# Patient Record
Sex: Female | Born: 1980 | Race: Black or African American | Hispanic: No | Marital: Married | Smoking: Former smoker
Health system: Southern US, Community
[De-identification: ages and names within clinical notes are randomized; demographics above are authoritative.]

## PROBLEM LIST (undated history)

## (undated) DIAGNOSIS — Z8371 Family history of colonic polyps: Secondary | ICD-10-CM

## (undated) DIAGNOSIS — G932 Benign intracranial hypertension: Secondary | ICD-10-CM

## (undated) DIAGNOSIS — Z800 Family history of malignant neoplasm of digestive organs: Secondary | ICD-10-CM

## (undated) DIAGNOSIS — Z803 Family history of malignant neoplasm of breast: Secondary | ICD-10-CM

## (undated) DIAGNOSIS — Z8041 Family history of malignant neoplasm of ovary: Secondary | ICD-10-CM

## (undated) DIAGNOSIS — Z83719 Family history of colon polyps, unspecified: Secondary | ICD-10-CM

## (undated) HISTORY — DX: Benign intracranial hypertension: G93.2

## (undated) HISTORY — DX: Family history of malignant neoplasm of digestive organs: Z80.0

## (undated) HISTORY — DX: Family history of malignant neoplasm of ovary: Z80.41

## (undated) HISTORY — DX: Family history of malignant neoplasm of breast: Z80.3

## (undated) HISTORY — DX: Family history of colon polyps, unspecified: Z83.719

## (undated) HISTORY — DX: Family history of colonic polyps: Z83.71

---

## 2007-10-01 LAB — CONVERTED CEMR LAB: Pap Smear: NORMAL

## 2009-06-26 ENCOUNTER — Other Ambulatory Visit: Admission: RE | Admit: 2009-06-26 | Discharge: 2009-06-26 | Payer: Self-pay | Admitting: Internal Medicine

## 2009-06-26 ENCOUNTER — Ambulatory Visit: Payer: Self-pay | Admitting: Internal Medicine

## 2009-06-26 LAB — CONVERTED CEMR LAB
Chlamydia, DNA Probe: NEGATIVE
Eosinophils Relative: 1.6 % (ref 0.0–5.0)
GC Probe Amp, Genital: NEGATIVE
GFR calc non Af Amer: 103.75 mL/min (ref >60)
Glucose, Bld: 77 mg/dL (ref 70–99)
HDL: 73.6 mg/dL (ref >39.00)
MCV: 96.3 fL (ref 78.0–100.0)
Monocytes Absolute: 0.3 10*3/uL (ref 0.1–1.0)
Neutrophils Relative %: 35.7 % — ABNORMAL LOW (ref 43.0–77.0)
Pap Smear: NEGATIVE
Platelets: 229 10*3/uL (ref 150.0–400.0)
Potassium: 4.6 meq/L (ref 3.5–5.1)
Sodium: 138 meq/L (ref 135–145)
TSH: 0.8 microintl units/mL (ref 0.35–5.50)
Total CHOL/HDL Ratio: 2
VLDL: 7.2 mg/dL (ref 0.0–40.0)
WBC: 4 10*3/uL — ABNORMAL LOW (ref 4.5–10.5)

## 2009-06-28 ENCOUNTER — Encounter: Payer: Self-pay | Admitting: Internal Medicine

## 2009-11-18 ENCOUNTER — Emergency Department (HOSPITAL_COMMUNITY)
Admission: EM | Admit: 2009-11-18 | Discharge: 2009-11-18 | Payer: Self-pay | Source: Home / Self Care | Admitting: Family Medicine

## 2009-12-24 ENCOUNTER — Encounter: Admission: RE | Admit: 2009-12-24 | Discharge: 2009-12-24 | Payer: Self-pay | Admitting: Obstetrics and Gynecology

## 2010-04-01 NOTE — Letter (Signed)
Summary: Results Follow-up Letter  [facility]  [address] [place], [place] [address]   Phone: [phone]  Fax: [phone]    06/28/2009  [address] [place], [place]  [address]  Dear Ms. Younce,   The following are the results of your recent test(s):  Test     Result     Pap Smear    Normal__XX_____  Not Normal_____       Comments:    _________________________________________________________  Please call for an appointment as directed _________________________________________________________ _________________________________________________________ _________________________________________________________  Sincerely,  Sanda Linger MD [facility]

## 2010-04-01 NOTE — Assessment & Plan Note (Signed)
Summary: NEW CPX/MEDCOST/#/WILL COME FASTING/CD   Vital Signs:  Patient profile:   30 year old female Menstrual status:  regular LMP:     05/17/2009 Height:      68 inches Weight:      226 pounds BMI:     34.49 O2 Sat:      98 % on Room air Temp:     98.4 degrees F oral Pulse rate:   73 / minute Pulse rhythm:   regular Resp:     16 per minute BP sitting:   98 / 70  (left arm) Cuff size:   large  Vitals Entered By: Rock Nephew CMA (June 26, 2009 10:14 AM)  Nutrition Counseling: Patient's BMI is greater than 25 and therefore counseled on weight management options.  O2 Flow:  Room air CC: new to establish, Preventive Care LMP (date): 05/17/2009     Menstrual Status regular Enter LMP: 05/17/2009 Last PAP Result normal   Primary Care Provider:  Etta Grandchild MD  CC:  new to establish and Preventive Care.  History of Present Illness: New to me for a complete physical. She wants to have her blood sugars and thyroid checked due to her family history. She feels well and offers no complaints.  Preventive Screening-Counseling & Management  Alcohol-Tobacco     Alcohol drinks/day: <1     Alcohol type: wine     >5/day in last 3 mos: no     Alcohol Counseling: not indicated; use of alcohol is not excessive or problematic     Feels need to cut down: no     Feels annoyed by complaints: no     Feels guilty re: drinking: no     Needs 'eye opener' in am: no     Smoking Status: never  Caffeine-Diet-Exercise     Does Patient Exercise: yes  Hep-HIV-STD-Contraception     Hepatitis Risk: no risk noted     HIV Risk: no risk noted     STD Risk: no risk noted     SBE monthly: yes     SBE Education/Counseling: to perform regular SBE  Safety-Violence-Falls     Seat Belt Use: yes     Helmet Use: yes     Firearms in the Home: no firearms in the home     Smoke Detectors: no     Violence in the Home: no risk noted     Sexual Abuse: no      Drug Use:  no.    Current  Medications (verified): 1)  None  Allergies (verified): 1)  ! Penicillin  Past History:  Past Medical History: Pseudotumor cerebri  Past Surgical History: Caesarean section X 3  Family History: Family History of Arthritis Family History High cholesterol Family History Hypertension Family History Thyroid disease Family History Diabetes 1st degree relative  Social History: Occupation: MBA, Production manager Married Never Smoked Alcohol use-yes Drug use-no Regular exercise-yes Smoking Status:  never Drug Use:  no Does Patient Exercise:  yes Hepatitis Risk:  no risk noted HIV Risk:  no risk noted STD Risk:  no risk noted Seat Belt Use:  yes  Review of Systems  The patient denies anorexia, fever, weight loss, weight gain, vision loss, chest pain, syncope, peripheral edema, prolonged cough, headaches, hemoptysis, abdominal pain, hematuria, suspicious skin lesions, depression, enlarged lymph nodes, and breast masses.    Physical Exam  General:  alert, well-developed, well-nourished, well-hydrated, appropriate dress, normal appearance, healthy-appearing, cooperative to examination, good hygiene, and overweight-appearing.  Head:  normocephalic, atraumatic, no abnormalities observed, and no abnormalities palpated.   Eyes:  vision grossly intact, pupils equal, pupils round, and pupils reactive to light.   Ears:  R ear normal and L ear normal.   Mouth:  Oral mucosa and oropharynx without lesions or exudates.  Teeth in good repair. Neck:  supple, full ROM, no masses, no thyromegaly, no thyroid nodules or tenderness, no JVD, normal carotid upstroke, no carotid bruits, no cervical lymphadenopathy, and no neck tenderness.   Chest Wall:  no deformities, no tenderness, and no mass.   Breasts:  skin/areolae normal, no masses, no abnormal thickening, no nipple discharge, no tenderness, and no adenopathy.   Lungs:  normal respiratory effort, no intercostal retractions, no accessory muscle  use, normal breath sounds, no dullness, no fremitus, no crackles, and no wheezes.   Heart:  normal rate, regular rhythm, no murmur, no gallop, no rub, and no JVD.   Abdomen:  soft, non-tender, normal bowel sounds, no distention, no masses, no guarding, no rigidity, no rebound tenderness, no abdominal hernia, no inguinal hernia, no hepatomegaly, and no splenomegaly.   Rectal:  No external abnormalities noted. Normal sphincter tone. No rectal masses or tenderness. Genitalia:  Normal introitus for age, no external lesions, no vaginal discharge, mucosa pink and moist, no vaginal or cervical lesions, no vaginal atrophy, no friaility or hemorrhage, normal uterus size and position, no adnexal masses or tenderness Msk:  No deformity or scoliosis noted of thoracic or lumbar spine.   Pulses:  R and L carotid,radial,femoral,dorsalis pedis and posterior tibial pulses are full and equal bilaterally Extremities:  No clubbing, cyanosis, edema, or deformity noted with normal full range of motion of all joints.   Neurologic:  No cranial nerve deficits noted. Station and gait are normal. Plantar reflexes are down-going bilaterally. DTRs are symmetrical throughout. Sensory, motor and coordinative functions appear intact. Skin:  turgor normal, color normal, no rashes, no suspicious lesions, no ecchymoses, no petechiae, no purpura, no ulcerations, no edema, and tattoo(s).   Cervical Nodes:  no anterior cervical adenopathy and no posterior cervical adenopathy.   Axillary Nodes:  no R axillary adenopathy and no L axillary adenopathy.   Inguinal Nodes:  no R inguinal adenopathy and no L inguinal adenopathy.   Psych:  Cognition and judgment appear intact. Alert and cooperative with normal attention span and concentration. No apparent delusions, illusions, hallucinations   Impression & Recommendations:  Problem # 1:  ROUTINE GENERAL MEDICAL EXAM@HEALTH  CARE FACL (ICD-V70.0) Assessment New  Pap smear: normal  (10/01/2007) Td Booster: Tdap (06/26/2009)    Discussed using sunscreen, use of alcohol, drug use, self breast exam, routine dental care, routine eye care, schedule for GYN exam, routine physical exam, seat belts, multiple vitamins, osteoporosis prevention, adequate calcium intake in diet, recommendations for immunizations, mammograms and Pap smears.  Discussed exercise and checking cholesterol.  Discussed gun safety, safe sex, and contraception.  Orders: Venipuncture ([address]) TLB-Lipid Panel (80061-LIPID) TLB-CBC Platelet - w/Differential (85025-CBCD) TLB-BMP (Basic Metabolic Panel-BMET) (80048-METABOL) T-Chlamydia Probe, genital (60454-09811) T-GC Probe, genital (91478-29562) TLB-TSH (Thyroid Stimulating Hormone) (84443-TSH)  Other Orders: Tdap => 57yrs IM ([address]) Admin 1st Vaccine ([address])  PAP Screening:    Hx Cervical Dysplasia in last 5 yrs? No    3 normal PAP smears in last 5 yrs? Yes    Last PAP smear:  10/01/2007    Reviewed PAP smear recommendations:  PAP smear done  Osteoporosis Risk Assessment:  Risk Factors for Fracture or Low Bone Density:  Smoking status:       never  Immunization & Chemoprophylaxis:    Tetanus vaccine: Tdap  (06/26/2009)  Patient Instructions: 1)  It is important that you exercise regularly at least 20 minutes 5 times a week. If you develop chest pain, have severe difficulty breathing, or feel very tired , stop exercising immediately and seek medical attention. 2)  You need to lose weight. Consider a lower calorie diet and regular exercise.  3)  It is not healthy  for men to drink more than 2-3 drinks per day or for women to drink more than 1-2 drinks per day. 4)  You need to have a Pap Smear to prevent cervical cancer. 5)  If you are having sex and you or your partner don't want a child, use contraception. 6)  Please schedule a follow-up appointment as needed.  Preventive Care Screening  Pap Smear:    Date:  10/01/2007    Results:  normal       Immunizations Administered:  Tetanus Vaccine:    Vaccine Type: Tdap    Site: right deltoid    Mfr: GlaxoSmithKline    Dose: 0.5 ml    Route: IM    Given by: Rock Nephew CMA    Exp. Date: 05/25/2011    Lot #: [id number]    VIS given: 01/18/07 version given June 26, 2009.

## 2010-04-01 NOTE — Letter (Signed)
Summary: Results Follow-up Letter  [facility]  [address] [place], [place] [address]   Phone: [phone]  Fax: [phone]    06/26/2009  [address] [place], [place]  [address]  Dear Ms. Donoso,   The following are the results of your recent test(s):  Test     Result     CBC       normal Blood sugar     normal Thyroid     normal Kidney     normal   _________________________________________________________  Please call for an appointment as needed _________________________________________________________ _________________________________________________________ _________________________________________________________  Sincerely,  Sanda Linger MD [facility]

## 2010-04-01 NOTE — Letter (Signed)
Summary: Lipid Letter  [facility]  [address] [place], [place] [address]   Phone: [phone]  Fax: [phone]    06/26/2009  [facility] [address] [place], [place]  [address]  Dear Yvonne Johnson:  We have carefully reviewed your last lipid profile from 06/26/2009 and the results are noted below with a summary of recommendations for lipid management.    Cholesterol:       162     Goal: <200   HDL "good" Cholesterol:   57.84     Goal: >40   LDL "bad" Cholesterol:   81     Goal: <130   Triglycerides:       36.0     Goal: <150    EXCELLENT RESULTS!!!!!!!!!    TLC Diet (Therapeutic Lifestyle Change): Saturated Fats & Transfatty acids should be kept < 7% of total calories ***Reduce Saturated Fats Polyunstaurated Fat can be up to 10% of total calories Monounsaturated Fat Fat can be up to 20% of total calories Total Fat should be no greater than 25-35% of total calories Carbohydrates should be 50-60% of total calories Protein should be approximately 15% of total calories Fiber should be at least 20-30 grams a day ***Increased fiber may help lower LDL Total Cholesterol should be < 200mg /day Consider adding plant stanol/sterols to diet (example: Benacol spread) ***A higher intake of unsaturated fat may reduce Triglycerides and Increase HDL    Adjunctive Measures (may lower LIPIDS and reduce risk of Heart Attack) include: Aerobic Exercise (20-30 minutes 3-4 times a week) Limit Alcohol Consumption Weight Reduction Aspirin 75-81 mg a day by mouth (if not allergic or contraindicated) Dietary Fiber 20-30 grams a day by mouth     Current Medications:  None If you have any questions, please call. We appreciate being able to work with you.   Sincerely,    [facility] Etta Grandchild MD

## 2010-06-05 ENCOUNTER — Ambulatory Visit: Payer: Self-pay | Admitting: Internal Medicine

## 2012-05-25 LAB — LIPID PANEL
CHOLESTEROL: 172 mg/dL (ref 0–200)
HDL: 100 mg/dL — AB (ref 35–70)
TRIGLYCERIDES: 67 mg/dL (ref 40–160)

## 2012-05-25 LAB — CBC AND DIFFERENTIAL
HEMATOCRIT: 40 % (ref 36–46)
Hemoglobin: 13.1 g/dL (ref 12.0–16.0)
Platelets: 223 10*3/uL (ref 150–399)
WBC: 5.1 10^3/mL

## 2012-05-25 LAB — BASIC METABOLIC PANEL
BUN: 12 mg/dL (ref 4–21)
CREATININE: 1 mg/dL (ref 0.5–1.1)
Glucose: 86 mg/dL
Potassium: 5.3 mmol/L (ref 3.4–5.3)
SODIUM: 143 mmol/L (ref 137–147)

## 2012-05-25 LAB — HEPATIC FUNCTION PANEL
ALT: 10 U/L (ref 7–35)
AST: 14 U/L (ref 13–35)
Bilirubin, Total: 0.2 mg/dL

## 2013-07-07 ENCOUNTER — Ambulatory Visit (INDEPENDENT_AMBULATORY_CARE_PROVIDER_SITE_OTHER): Payer: 59 | Admitting: Physician Assistant

## 2013-07-07 ENCOUNTER — Encounter: Payer: Self-pay | Admitting: Physician Assistant

## 2013-07-07 VITALS — BP 116/64 | HR 92 | Resp 18 | Ht 66.5 in | Wt 207.0 lb

## 2013-07-07 DIAGNOSIS — R140 Abdominal distension (gaseous): Secondary | ICD-10-CM

## 2013-07-07 DIAGNOSIS — G932 Benign intracranial hypertension: Secondary | ICD-10-CM | POA: Insufficient documentation

## 2013-07-07 DIAGNOSIS — R1032 Left lower quadrant pain: Secondary | ICD-10-CM

## 2013-07-07 DIAGNOSIS — Z8041 Family history of malignant neoplasm of ovary: Secondary | ICD-10-CM

## 2013-07-07 DIAGNOSIS — R142 Eructation: Secondary | ICD-10-CM

## 2013-07-07 DIAGNOSIS — M545 Low back pain, unspecified: Secondary | ICD-10-CM

## 2013-07-07 DIAGNOSIS — R141 Gas pain: Secondary | ICD-10-CM

## 2013-07-07 DIAGNOSIS — N6452 Nipple discharge: Secondary | ICD-10-CM

## 2013-07-07 DIAGNOSIS — R143 Flatulence: Secondary | ICD-10-CM

## 2013-07-07 DIAGNOSIS — R634 Abnormal weight loss: Secondary | ICD-10-CM

## 2013-07-07 DIAGNOSIS — N6459 Other signs and symptoms in breast: Secondary | ICD-10-CM

## 2013-07-07 LAB — POCT URINE PREGNANCY: PREG TEST UR: NEGATIVE

## 2013-07-07 NOTE — Progress Notes (Signed)
Subjective:    Patient ID: Yvonne Johnson, female    DOB: February 25, 1981, 33 y.o.   MRN: [id number]  HPI Pt is a 33 yo new pt would presents to the clinic to establish care.   . Active Ambulatory Problems    Diagnosis Date Noted  . Pseudotumor cerebri    Resolved Ambulatory Problems    Diagnosis Date Noted  . No Resolved Ambulatory Problems   No Additional Past Medical History   . Family History  Problem Relation Age of Onset  . Arthritis Other   . Hyperlipidemia Other   . Hypertension Other   . Thyroid disease Other   . Diabetes Other   . Cancer Maternal Aunt     cervical or ovarian  . Hypertension Maternal Aunt   . Cancer Paternal Aunt     breast  . Hypertension Paternal Aunt   . Hypertension Mother   . Hypothyroidism Mother   . Hypertension Father   . Hypertension Sister   . Hypothyroidism Sister   . Hypertension Maternal Grandmother   . Miscarriages / Stillbirths Maternal Grandmother   . Hypertension Maternal Grandfather   . Diabetes Paternal Grandmother   . Hypertension Paternal Grandmother   . Heart attack Paternal Grandfather   . Hypertension Paternal Grandfather   . Diabetes Paternal Grandfather    . History   Social History  . Marital Status: Married    Spouse Name: N/A    Number of Children: N/A  . Years of Education: N/A   Occupational History  . [employer] History Main Topics  . Smoking status: Never Smoker   . Smokeless tobacco: Never Used  . Alcohol Use: Yes  . Drug Use: No  . Sexual Activity: Not on file   Other Topics Concern  . Not on file   Social History Narrative   Regular Exercise -  YES         Pt is having some ongoing problems that she does not feel like are being worked up by other providers. She reports bilateral clear nipple discharge. Scant amounts of discharge but sometimes more than others. Left lower quadrant discomfort is more like a dullness feeling. Abdominal bloating. Bilateral back pain.  Denies any painful urination.  She has been worked up for kidney stones and was negative. No other imaging has been done. Reports bowel movements are normal for her and soft. She has lost 10 lbs unintentionally over past 2 months. She just feels tired and worned down. She is concerned that all the symptoms are something else.    Review of Systems  All other systems reviewed and are negative.      Objective:   Physical Exam  Constitutional: She is oriented to person, place, and time. She appears well-developed and well-nourished.  HENT:  Head: Normocephalic and atraumatic.  Cardiovascular: Normal rate, regular rhythm and normal heart sounds.   Pulmonary/Chest: Effort normal and breath sounds normal.  No CVA tenderness.   Abdominal: Soft. Bowel sounds are normal.  Abdomen presents with stretch marks on lower quadrants.  Some slight distention present.  Tenderness over lower left quadrant to palpation.  No masses. No guarding. No rebound.    Neurological: She is alert and oriented to person, place, and time.  Psychiatric: She has a normal mood and affect. Her behavior is normal.          Assessment & Plan:  abdominal bloating/nipple discharge/LLQ pain/lower back pain- pt has a strong family hx  for cancer. I would like to get pelvic ultrasound as well as labs. I will check hormones testoerone, DHEA, prolactin,TSH and CA125. Concerned with something going on at [facility] level or even ovarian level. Will get labs and imaging and follow up. Likely breast discharge is normal. Color is not concerning and bilaterally. Will continue to monitor.

## 2013-07-07 NOTE — Patient Instructions (Addendum)
Will get pelvic ultrasound.  Will call with labs.  Follow up after imaging and labs for next steps.

## 2013-07-08 LAB — CBC WITH DIFFERENTIAL/PLATELET
BASOS ABS: 0 10*3/uL (ref 0.0–0.1)
BASOS PCT: 1 % (ref 0–1)
EOS ABS: 0.1 10*3/uL (ref 0.0–0.7)
EOS PCT: 2 % (ref 0–5)
HCT: 38.9 % (ref 36.0–46.0)
Hemoglobin: 13 g/dL (ref 12.0–15.0)
Lymphocytes Relative: 46 % (ref 12–46)
Lymphs Abs: 2 10*3/uL (ref 0.7–4.0)
MCH: 31.8 pg (ref 26.0–34.0)
MCHC: 33.4 g/dL (ref 30.0–36.0)
MCV: 95.1 fL (ref 78.0–100.0)
Monocytes Absolute: 0.3 10*3/uL (ref 0.1–1.0)
Monocytes Relative: 8 % (ref 3–12)
NEUTROS PCT: 43 % (ref 43–77)
Neutro Abs: 1.8 10*3/uL (ref 1.7–7.7)
PLATELETS: 243 10*3/uL (ref 150–400)
RBC: 4.09 MIL/uL (ref 3.87–5.11)
RDW: 14 % (ref 11.5–15.5)
WBC: 4.3 10*3/uL (ref 4.0–10.5)

## 2013-07-08 LAB — COMPLETE METABOLIC PANEL WITH GFR
ALT: 8 U/L (ref 0–35)
AST: 12 U/L (ref 0–37)
Albumin: 4.3 g/dL (ref 3.5–5.2)
Alkaline Phosphatase: 55 U/L (ref 39–117)
BUN: 12 mg/dL (ref 6–23)
CALCIUM: 9.6 mg/dL (ref 8.4–10.5)
CHLORIDE: 104 meq/L (ref 96–112)
CO2: 26 meq/L (ref 19–32)
CREATININE: 0.88 mg/dL (ref 0.50–1.10)
GFR, EST NON AFRICAN AMERICAN: 85 mL/min
GLUCOSE: 83 mg/dL (ref 70–99)
Potassium: 4 mEq/L (ref 3.5–5.3)
Sodium: 139 mEq/L (ref 135–145)
Total Bilirubin: 0.4 mg/dL (ref 0.2–1.2)
Total Protein: 7.5 g/dL (ref 6.0–8.3)

## 2013-07-08 LAB — T4, FREE: Free T4: 0.88 ng/dL (ref 0.80–1.80)

## 2013-07-08 LAB — CA 125: CA 125: 15.1 U/mL (ref 0.0–30.2)

## 2013-07-08 LAB — TSH: TSH: 1.52 u[IU]/mL (ref 0.350–4.500)

## 2013-07-08 LAB — PROLACTIN: Prolactin: 7.4 ng/mL

## 2013-07-10 DIAGNOSIS — R634 Abnormal weight loss: Secondary | ICD-10-CM | POA: Insufficient documentation

## 2013-07-10 DIAGNOSIS — N6452 Nipple discharge: Secondary | ICD-10-CM | POA: Insufficient documentation

## 2013-07-10 DIAGNOSIS — M545 Low back pain, unspecified: Secondary | ICD-10-CM | POA: Insufficient documentation

## 2013-07-10 DIAGNOSIS — R1032 Left lower quadrant pain: Secondary | ICD-10-CM | POA: Insufficient documentation

## 2013-07-10 DIAGNOSIS — R140 Abdominal distension (gaseous): Secondary | ICD-10-CM | POA: Insufficient documentation

## 2013-07-10 LAB — TESTOSTERONE, FREE, TOTAL, SHBG
SEX HORMONE BINDING: 70 nmol/L (ref 18–114)
TESTOSTERONE FREE: 4.8 pg/mL (ref 0.6–6.8)
TESTOSTERONE: 44 ng/dL (ref 10–70)
Testosterone-% Free: 1.1 % (ref 0.4–2.4)

## 2013-07-10 LAB — DHEA-SULFATE: DHEA SO4: 196 ug/dL (ref 35–430)

## 2013-07-14 ENCOUNTER — Ambulatory Visit (INDEPENDENT_AMBULATORY_CARE_PROVIDER_SITE_OTHER): Payer: 59

## 2013-07-14 DIAGNOSIS — R634 Abnormal weight loss: Secondary | ICD-10-CM

## 2013-07-14 DIAGNOSIS — R141 Gas pain: Secondary | ICD-10-CM

## 2013-07-14 DIAGNOSIS — R1032 Left lower quadrant pain: Secondary | ICD-10-CM

## 2013-07-14 DIAGNOSIS — R142 Eructation: Secondary | ICD-10-CM

## 2013-07-14 DIAGNOSIS — R143 Flatulence: Secondary | ICD-10-CM

## 2013-07-19 ENCOUNTER — Encounter: Payer: Self-pay | Admitting: *Deleted

## 2013-10-29 ENCOUNTER — Emergency Department
Admission: EM | Admit: 2013-10-29 | Discharge: 2013-10-29 | Disposition: A | Discharge status: Home / Self Care | Payer: 59 | Source: Home / Self Care | Attending: Family Medicine | Admitting: Family Medicine

## 2013-10-29 ENCOUNTER — Encounter: Payer: Self-pay | Admitting: Emergency Medicine

## 2013-10-29 DIAGNOSIS — R142 Eructation: Secondary | ICD-10-CM

## 2013-10-29 DIAGNOSIS — R140 Abdominal distension (gaseous): Secondary | ICD-10-CM

## 2013-10-29 DIAGNOSIS — R143 Flatulence: Secondary | ICD-10-CM

## 2013-10-29 DIAGNOSIS — R141 Gas pain: Secondary | ICD-10-CM

## 2013-10-29 NOTE — ED Provider Notes (Signed)
CSN: [id number]     Arrival date & time 10/29/13  1701 History   First MD Initiated Contact with Patient 10/29/13 1737     Chief Complaint  Patient presents with  . GI Problem      HPI Comments: Patient complains of approximately four month history of constipation (3 to 7 days) alternating with frequent loose stools containing mucous (1 to 2 days).  She has a recurring feeling of abdominal bloating and fullness.  No nausea/vomiting.  No fevers, chills, and sweats.  She had a mucous stool today and believes that she may have seen a worm.   She reports weight loss. Family history:  Father has colonic polyps.  Grandmother died in her 54's of colon CA.  The history is provided by the patient.    Past Medical History  Diagnosis Date  . Pseudotumor cerebri    Past Surgical History  Procedure Laterality Date  . Cesarean section      x 3   Family History  Problem Relation Age of Onset  . Arthritis Other   . Hyperlipidemia Other   . Hypertension Other   . Thyroid disease Other   . Diabetes Other   . Cancer Maternal Aunt     cervical or ovarian  . Hypertension Maternal Aunt   . Cancer Paternal Aunt     breast  . Hypertension Paternal Aunt   . Hypertension Mother   . Hypothyroidism Mother   . Hypertension Father   . Hypertension Sister   . Hypothyroidism Sister   . Hypertension Maternal Grandmother   . Miscarriages / Stillbirths Maternal Grandmother   . Hypertension Maternal Grandfather   . Diabetes Paternal Grandmother   . Hypertension Paternal Grandmother   . Heart attack Paternal Grandfather   . Hypertension Paternal Grandfather   . Diabetes Paternal Grandfather    History  Substance Use Topics  . Smoking status: Never Smoker   . Smokeless tobacco: Never Used  . Alcohol Use: Yes   OB History   Grav Para Term Preterm Abortions TAB SAB Ect Mult Living                 Review of Systems  Constitutional: Positive for chills and unexpected weight change. Negative for  fever, activity change, appetite change and fatigue.  HENT: Negative.   Eyes: Negative.   Respiratory: Negative.   Cardiovascular: Negative.   Gastrointestinal: Positive for abdominal pain, diarrhea, constipation and abdominal distention. Negative for nausea, vomiting, blood in stool, anal bleeding and rectal pain.  Genitourinary: Negative.   Musculoskeletal: Negative.   Skin: Negative.   Neurological: Negative for headaches.    Allergies  Penicillins  Home Medications   Prior to Admission medications   Medication Sig Start Date End Date Taking? Authorizing Provider  Probiotic Product (PROBIOTIC DAILY PO) Take by mouth.   Yes Historical Provider, MD   BP 136/88  Pulse 75  Temp(Src) 98.8 F (37.1 C) (Oral)  Ht 5\' 8"  (1.727 m)  Wt 214 lb (97.07 kg)  BMI 32.55 kg/m2  SpO2 99%  LMP 10/02/2013 Physical Exam Nursing notes and Vital Signs reviewed. Appearance:  Patient appears stated age, and in no acute distress.  Patient is obese (BMI 32.6) Eyes:  Pupils are equal, round, and reactive to light and accomodation.  Extraocular movement is intact.  Conjunctivae are not inflamed  Ears:  Canals normal.  Tympanic membranes normal.  Nose:  Mildly congested turbinates.  No sinus tenderness.   Pharynx:  Normal; moist mucous  membranes  Neck:  Supple.  No adenopathy Lungs:  Clear to auscultation.  Breath sounds are equal.  Heart:  Regular rate and rhythm without murmurs, rubs, or gallops.  Abdomen:  Nontender without masses or hepatosplenomegaly.  Bowel sounds are present.  No CVA or flank tenderness.  Extremities:  No edema.  No calf tenderness Skin:  No rash present.   ED Course  Procedures  none    Labs Reviewed  STOOL CULTURE  OVA AND PARASITE EXAMINATION  FECAL LACTOFERRIN  CBC WITH DIFFERENTIAL      MDM   1. Abdominal bloating    CBC pending Stool lactoferrin.  Stool culture, OandP pending Followup with PCP    Kandra Nicolas, MD 11/03/13 [phone]

## 2013-10-29 NOTE — Discharge Instructions (Signed)
Bloating Bloating is the feeling of fullness in your belly. You may feel as though your pants are too tight. Often the cause of bloating is overeating, retaining fluids, or having gas in your bowel. It is also caused by swallowing air and eating foods that cause gas. Irritable bowel syndrome is one of the most common causes of bloating. Constipation is also a common cause. Sometimes more serious problems can cause bloating. SYMPTOMS  Usually there is a feeling of fullness, as though your abdomen is bulged out. There may be mild discomfort.  DIAGNOSIS  Usually no particular testing is necessary for most bloating. If the condition persists and seems to become worse, your caregiver may do additional testing.  TREATMENT   There is no direct treatment for bloating.  Do not put gas into the bowel. Avoid chewing gum and sucking on candy. These tend to make you swallow air. Swallowing air can also be a nervous habit. Try to avoid this.  Avoiding high residue diets will help. Eat foods with soluble fibers (examples include root vegetables, apples, or barley) and substitute dairy products with soy and rice products. This helps irritable bowel syndrome.  If constipation is the cause, then a high residue diet with more fiber will help.  Avoid carbonated beverages.  Over-the-counter preparations are available that help reduce gas. Your pharmacist can help you with this. SEEK MEDICAL CARE IF:   Bloating continues and seems to be getting worse.  You notice a weight gain.  You have a weight loss but the bloating is getting worse.  You have changes in your bowel habits or develop nausea or vomiting. SEEK IMMEDIATE MEDICAL CARE IF:   You develop shortness of breath or swelling in your legs.  You have an increase in abdominal pain or develop chest pain. Document Released: 12/17/2005 Document Revised: 05/11/2011 Document Reviewed: 02/04/2007 ExitCare Patient Information 2015 [employer]. This  information is not intended to replace advice given to you by your health care provider. Make sure you discuss any questions you have with your health care provider.  

## 2013-10-29 NOTE — ED Notes (Signed)
Yvonne Johnson complains of constipation/mucus, stomach cramping and bloating off and on since May. She is being treated by [facility] for this with probiotics. She is here today because she reports seeing a worm in her stool. It was mainly mucus instead of stool.

## 2013-10-31 LAB — CBC WITH DIFFERENTIAL/PLATELET
Basophils Absolute: 0 10*3/uL (ref 0.0–0.1)
Basophils Relative: 0 % (ref 0–1)
EOS ABS: 0 10*3/uL (ref 0.0–0.7)
EOS PCT: 1 % (ref 0–5)
HCT: 37.9 % (ref 36.0–46.0)
HEMOGLOBIN: 12.6 g/dL (ref 12.0–15.0)
LYMPHS ABS: 2.4 10*3/uL (ref 0.7–4.0)
Lymphocytes Relative: 49 % — ABNORMAL HIGH (ref 12–46)
MCH: 31.7 pg (ref 26.0–34.0)
MCHC: 33.2 g/dL (ref 30.0–36.0)
MCV: 95.2 fL (ref 78.0–100.0)
MONOS PCT: 7 % (ref 3–12)
Monocytes Absolute: 0.3 10*3/uL (ref 0.1–1.0)
Neutro Abs: 2.1 10*3/uL (ref 1.7–7.7)
Neutrophils Relative %: 43 % (ref 43–77)
Platelets: 212 10*3/uL (ref 150–400)
RBC: 3.98 MIL/uL (ref 3.87–5.11)
RDW: 14 % (ref 11.5–15.5)
WBC: 4.8 10*3/uL (ref 4.0–10.5)

## 2013-11-07 ENCOUNTER — Encounter: Payer: Self-pay | Admitting: Physician Assistant

## 2013-11-24 ENCOUNTER — Ambulatory Visit (INDEPENDENT_AMBULATORY_CARE_PROVIDER_SITE_OTHER): Payer: 59 | Admitting: Family

## 2013-11-24 ENCOUNTER — Ambulatory Visit (INDEPENDENT_AMBULATORY_CARE_PROVIDER_SITE_OTHER): Payer: 59

## 2013-11-24 ENCOUNTER — Encounter: Payer: Self-pay | Admitting: Family

## 2013-11-24 VITALS — BP 104/72 | HR 67 | Resp 16 | Ht 68.0 in | Wt 213.0 lb

## 2013-11-24 DIAGNOSIS — N926 Irregular menstruation, unspecified: Secondary | ICD-10-CM

## 2013-11-24 NOTE — Progress Notes (Signed)
  Subjective:     Yvonne Johnson is a 33 y.o. female here for a routine exam.  Current complaints: irregular cycles x 6 months ranging between 14-38 day cycles.  Reports regular cycles every 24 days prior to this.  Patient's last menstrual period was 11/15/2013.  Last baby 6 years ago.  Pt denies using birth control during this time period and is in committed, sexually active relationship. Also reports increased bloating and intermittent left lower pelvic pain.  Ultrasound was completed in May 2015 > normal.  Pt concerned about family history of thyroid issues that required surgery despite having normal TSH.  Pt also states that her pseudotumor cerebri is in remission and is not requiring meds.   Personal health questionnaire reviewed: yes.   Gynecologic History Patient's last menstrual period was 11/15/2013. Contraception: none Last Pap: 2014. Results were: normal Last mammogram: n/a  Obstetric History OB History  Gravida Para Term Preterm AB SAB TAB Ectopic Multiple Living  3 3 3      1 4     # Outcome Date GA Lbr Len/2nd Weight Sex Delivery Anes PTL Lv  3 TRM 11/30/03 [redacted]w[redacted]d    CS        Comments: Twins  2 TRM           1 TRM                The following portions of the patient's history were reviewed and updated as appropriate: allergies, current medications, past family history, past medical history, past social history, past surgical history and problem list.  Review of Systems Pertinent items are noted in HPI.    Objective:   BP 104/72  Pulse 67  Resp 16  Ht 5\' 8"  (1.727 m)  Wt 213 lb (96.616 kg)  BMI 32.39 kg/m2  LMP 11/15/2013 General appearance: alert, cooperative and appears stated age Head: Normocephalic, without obvious abnormality, atraumatic Neck: no adenopathy, no carotid bruit, no JVD, supple, symmetrical, trachea midline and thyroid slightly enlarged, symmetric, no tenderness/mass/nodules Lungs: clear to auscultation bilaterally Breasts: normal appearance,  no masses or tenderness, No nipple retraction or dimpling, No nipple discharge or bleeding, No axillary or supraclavicular adenopathy, Normal to palpation without dominant masses, Taught monthly breast self examination Heart: regular rate and rhythm, S1, S2 normal, no murmur, click, rub or gallop Abdomen: soft, non-tender; bowel sounds normal; no masses,  no organomegaly Pelvic: cervix normal in appearance, external genitalia normal, no adnexal masses or tenderness, no cervical motion tenderness, rectovaginal septum normal, uterus normal size, shape, and consistency and vagina normal without discharge Skin: Skin color, texture, turgor normal. No rashes or lesions     Assessment:   Irregular Menstrual Cycle Pseudotumor Cerebri - remission.    Plan:    Contraception: OCP (estrogen/progesterone). Labs:  TSH, FSH, LH Thyroid ultrasound Follow-up in 3-4 weeks  [place], CNM

## 2013-11-24 NOTE — Progress Notes (Signed)
Pt works for [employer] and pap and labs sent to them.

## 2013-11-25 LAB — SPECIMEN STATUS REPORT

## 2013-11-25 LAB — TSH: TSH: 1.36 u[IU]/mL (ref 0.450–4.500)

## 2013-11-25 LAB — LUTEINIZING HORMONE: LH: 15.1 m[IU]/mL

## 2013-11-25 LAB — FOLLICLE STIMULATING HORMONE: FSH: 7.3 m[IU]/mL

## 2013-11-28 LAB — PAP IG AND HPV HIGH-RISK
HPV, high-risk: NEGATIVE
PAP SMEAR COMMENT: 0

## 2013-12-15 ENCOUNTER — Ambulatory Visit: Payer: 59 | Admitting: Obstetrics & Gynecology

## 2014-01-01 ENCOUNTER — Encounter: Payer: Self-pay | Admitting: Family

## 2014-04-13 ENCOUNTER — Encounter: Payer: Self-pay | Admitting: Physician Assistant

## 2014-04-13 ENCOUNTER — Ambulatory Visit (INDEPENDENT_AMBULATORY_CARE_PROVIDER_SITE_OTHER): Payer: 59 | Admitting: Physician Assistant

## 2014-04-13 VITALS — BP 124/72 | HR 70 | Ht 68.0 in | Wt 212.0 lb

## 2014-04-13 DIAGNOSIS — Z3009 Encounter for other general counseling and advice on contraception: Secondary | ICD-10-CM

## 2014-04-13 DIAGNOSIS — K5900 Constipation, unspecified: Secondary | ICD-10-CM

## 2014-04-13 DIAGNOSIS — R140 Abdominal distension (gaseous): Secondary | ICD-10-CM

## 2014-04-13 DIAGNOSIS — R1084 Generalized abdominal pain: Secondary | ICD-10-CM

## 2014-04-13 MED ORDER — NORETHINDRONE ACET-ETHINYL EST 1-20 MG-MCG PO TABS: 1.0000 | ORAL_TABLET | Freq: Every day | ORAL | Status: DC

## 2014-04-13 NOTE — Patient Instructions (Addendum)
Align- probiotic.  miralax 1 capful nightly as needed.    Gluten diet 2 weeks.   Oral Contraception Information Oral contraceptive pills (OCPs) are medicines taken to prevent pregnancy. OCPs work by preventing the ovaries from releasing eggs. The hormones in OCPs also cause the cervical mucus to thicken, preventing the sperm from entering the uterus. The hormones also cause the uterine lining to become thin, not allowing a fertilized egg to attach to the inside of the uterus. OCPs are highly effective when taken exactly as prescribed. However, OCPs do not prevent sexually transmitted diseases (STDs). Safe sex practices, such as using condoms along with the pill, can help prevent STDs.  Before taking the pill, you may have a physical exam and Pap test. Your health care provider may order blood tests. The health care provider will make sure you are a good candidate for oral contraception. Discuss with your health care provider the possible side effects of the OCP you may be prescribed. When starting an OCP, it can take 2 to 3 months for the body to adjust to the changes in hormone levels in your body.  TYPES OF ORAL CONTRACEPTION  The combination pill--This pill contains estrogen and progestin (synthetic progesterone) hormones. The combination pill comes in 21-day, 28-day, or 91-day packs. Some types of combination pills are meant to be taken continuously (365-day pills). With 21-day packs, you do not take pills for 7 days after the last pill. With 28-day packs, the pill is taken every day. The last 7 pills are without hormones. Certain types of pills have more than 21 hormone-containing pills. With 91-day packs, the first 84 pills contain both hormones, and the last 7 pills contain no hormones or contain estrogen only.  The minipill--This pill contains the progesterone hormone only. The pill is taken every day continuously. It is very important to take the pill at the same time each day. The minipill  comes in packs of 28 pills. All 28 pills contain the hormone.  ADVANTAGES OF ORAL CONTRACEPTIVE PILLS  Decreases premenstrual symptoms.   Treats menstrual period cramps.   Regulates the menstrual cycle.   Decreases a heavy menstrual flow.   May treatacne, depending on the type of pill.   Treats abnormal uterine bleeding.   Treats polycystic ovarian syndrome.   Treats endometriosis.   Can be used as emergency contraception.  THINGS THAT CAN MAKE ORAL CONTRACEPTIVE PILLS LESS EFFECTIVE OCPs can be less effective if:   You forget to take the pill at the same time every day.   You have a stomach or intestinal disease that lessens the absorption of the pill.   You take OCPs with other medicines that make OCPs less effective, such as antibiotics, certain HIV medicines, and some seizure medicines.   You take expired OCPs.   You forget to restart the pill on day 7, when using the packs of 21 pills.  RISKS ASSOCIATED WITH ORAL CONTRACEPTIVE PILLS  Oral contraceptive pills can sometimes cause side effects, such as:  Headache.  Nausea.  Breast tenderness.  Irregular bleeding or spotting. Combination pills are also associated with a small increased risk of:  Blood clots.  Heart attack.  Stroke. Document Released: 05/09/2002 Document Revised: 12/07/2012 Document Reviewed: 08/07/2012 [facility] Patient Information 2015 [place], [place]. This information is not intended to replace advice given to you by your health care provider. Make sure you discuss any questions you have with your health care provider.

## 2014-04-16 NOTE — Progress Notes (Signed)
   Subjective:    Patient ID: Yvonne Johnson, female    DOB: 06-Feb-1981, 34 y.o.   MRN: [id number]  HPI Pt is a 34 yo female who presents to follow up on abdominal pain and bloating. Last visit pain was more lower quadrants. Pelvic u/s was done and normal. Now pain is more upper quadrant mostly left. She admits that she has problems with constipation and miralax is helping with pain and bloating. Some days her stomach bloats to wear she feels pregnant. No fever or chills. No pain with bowel movements. Yogurt seems to help as well. Pain is relieved by gas or BM most of the time. Pt denies any acid reflux. Denies any blood in stool.    Review of Systems  All other systems reviewed and are negative.      Objective:   Physical Exam  Constitutional: She is oriented to person, place, and time. She appears well-developed and well-nourished.  HENT:  Head: Normocephalic and atraumatic.  Cardiovascular: Normal rate, regular rhythm and normal heart sounds.   Pulmonary/Chest: Effort normal and breath sounds normal.  Abdominal: Soft. Bowel sounds are normal. She exhibits no distension and no mass. There is no tenderness. There is no rebound and no guarding.  Neurological: She is alert and oriented to person, place, and time.  Skin: Skin is dry.  Psychiatric: She has a normal mood and affect. Her behavior is normal.          Assessment & Plan:  Contsipation/generalized abdominal pain/abdominal bloating- discussed possible need to try linzess or amitiza for more IBS syndrome. For now start probiotic and take miralax regularly. If symptoms improving then continue. If not consider other medications. Also consider gluten free diet for 2 weeks could help with some bloating and then slowly add back in other foods.   Refilled OCP. Up to date pap.

## 2014-04-17 DIAGNOSIS — R140 Abdominal distension (gaseous): Secondary | ICD-10-CM | POA: Insufficient documentation

## 2014-04-17 DIAGNOSIS — K5900 Constipation, unspecified: Secondary | ICD-10-CM | POA: Insufficient documentation

## 2014-04-17 DIAGNOSIS — R1084 Generalized abdominal pain: Secondary | ICD-10-CM | POA: Insufficient documentation

## 2015-09-26 ENCOUNTER — Telehealth: Payer: 59 | Admitting: Physician Assistant

## 2015-09-26 DIAGNOSIS — R399 Unspecified symptoms and signs involving the genitourinary system: Secondary | ICD-10-CM

## 2015-09-26 MED ORDER — CIPROFLOXACIN HCL 500 MG PO TABS: 500.0000 mg | ORAL_TABLET | Freq: Two times a day (BID) | ORAL | 0 refills | Status: DC

## 2015-09-26 NOTE — Progress Notes (Signed)

## 2015-09-27 ENCOUNTER — Encounter: Payer: Self-pay | Admitting: Physician Assistant

## 2015-10-04 ENCOUNTER — Ambulatory Visit (INDEPENDENT_AMBULATORY_CARE_PROVIDER_SITE_OTHER): Payer: 59 | Admitting: Physician Assistant

## 2015-10-04 ENCOUNTER — Encounter: Payer: Self-pay | Admitting: Physician Assistant

## 2015-10-04 VITALS — BP 123/78 | HR 77 | Ht 68.0 in | Wt 207.0 lb

## 2015-10-04 DIAGNOSIS — R140 Abdominal distension (gaseous): Secondary | ICD-10-CM | POA: Diagnosis not present

## 2015-10-04 DIAGNOSIS — Z13220 Encounter for screening for lipoid disorders: Secondary | ICD-10-CM | POA: Diagnosis not present

## 2015-10-04 DIAGNOSIS — Z131 Encounter for screening for diabetes mellitus: Secondary | ICD-10-CM

## 2015-10-04 DIAGNOSIS — N898 Other specified noninflammatory disorders of vagina: Secondary | ICD-10-CM

## 2015-10-04 DIAGNOSIS — Z0000 Encounter for general adult medical examination without abnormal findings: Secondary | ICD-10-CM

## 2015-10-04 LAB — WET PREP FOR TRICH, YEAST, CLUE
Clue Cells Wet Prep HPF POC: NONE SEEN
TRICH WET PREP: NONE SEEN
YEAST WET PREP: NONE SEEN

## 2015-10-04 NOTE — Patient Instructions (Signed)

## 2015-10-05 LAB — COMPLETE METABOLIC PANEL WITH GFR
ALBUMIN: 4.1 g/dL (ref 3.6–5.1)
ALK PHOS: 43 U/L (ref 33–115)
ALT: 8 U/L (ref 6–29)
AST: 12 U/L (ref 10–30)
BUN: 10 mg/dL (ref 7–25)
CHLORIDE: 103 mmol/L (ref 98–110)
CO2: 25 mmol/L (ref 20–31)
Calcium: 9.4 mg/dL (ref 8.6–10.2)
Creat: 0.82 mg/dL (ref 0.50–1.10)
GFR, Est African American: >89 mL/min (ref >=60)
GLUCOSE: 79 mg/dL (ref 65–99)
POTASSIUM: 4 mmol/L (ref 3.5–5.3)
SODIUM: 136 mmol/L (ref 135–146)
Total Bilirubin: 0.4 mg/dL (ref 0.2–1.2)
Total Protein: 7.4 g/dL (ref 6.1–8.1)

## 2015-10-05 LAB — LIPID PANEL
CHOL/HDL RATIO: 1.6 ratio (ref <=5.0)
Cholesterol: 172 mg/dL (ref 125–200)
HDL: 108 mg/dL (ref >=46)
LDL CALC: 54 mg/dL (ref <130)
Triglycerides: 52 mg/dL (ref <150)
VLDL: 10 mg/dL (ref <30)

## 2015-10-05 LAB — TSH: TSH: 1.12 mIU/L

## 2015-10-07 NOTE — Progress Notes (Signed)
Subjective:     Yvonne Johnson is a 35 y.o. female and is here for a comprehensive physical exam. The patient reports problems - she has been having some vaginal irritation and white to clear discharge with order for past few days. just started menstrual cycle. her bloating has improved since changes food that trigger and starting probtiotic. Marland Kitchen  Social History   Social History  . Marital status: Married    Spouse name: N/A  . Number of children: N/A  . Years of education: N/A   Occupational History  . [place] History Main Topics  . Smoking status: Never Smoker  . Smokeless tobacco: Never Used  . Alcohol use Yes  . Drug use: No  . Sexual activity: Yes    Birth control/ protection: None   Other Topics Concern  . Not on file   Social History Narrative   Regular Exercise -  YES         Health Maintenance  Topic Date Due  . INFLUENZA VACCINE  10/01/2015  . HIV Screening  10/06/2016 (Originally 07/05/1993)  . PAP SMEAR  11/24/2016  . TETANUS/TDAP  06/27/2019    The following portions of the patient's history were reviewed and updated as appropriate: allergies, current medications, past family history, past medical history, past social history, past surgical history and problem list.  Review of Systems Pertinent items noted in HPI and remainder of comprehensive ROS otherwise negative.   Objective:    BP 123/78   Pulse 77   Ht 5\' 8"  (1.727 m)   Wt 207 lb (93.9 kg)   LMP 10/03/2015   BMI 31.47 kg/m  General appearance: alert, cooperative and appears stated age Head: Normocephalic, without obvious abnormality, atraumatic Eyes: conjunctivae/corneas clear. PERRL, EOM's intact. Fundi benign. Ears: normal TM's and external ear canals both ears Nose: Nares normal. Septum midline. Mucosa normal. No drainage or sinus tenderness. Throat: lips, mucosa, and tongue normal; teeth and gums normal Neck: no adenopathy, no carotid bruit, no JVD, supple,  symmetrical, trachea midline and thyroid not enlarged, symmetric, no tenderness/mass/nodules Back: symmetric, no curvature. ROM normal. No CVA tenderness. Lungs: clear to auscultation bilaterally Breasts: normal appearance, no masses or tenderness Heart: regular rate and rhythm, S1, S2 normal, no murmur, click, rub or gallop Abdomen: soft, non-tender; bowel sounds normal; no masses,  no organomegaly Pelvic: external genitalia normal and vagina normal without discharge Extremities: extremities normal, atraumatic, no cyanosis or edema Pulses: 2+ and symmetric Skin: Skin color, texture, turgor normal. No rashes or lesions Lymph nodes: Cervical, supraclavicular, and axillary nodes normal. Neurologic: Alert and oriented X 3, normal strength and tone. Normal symmetric reflexes. Normal coordination and gait    Assessment:    Healthy female exam.      Plan:    CPE- pap up to date. Lipid, cmp, TSH ordered today. HIV declined. Discussed vitamin D 800 units and calcium 1500mg . Encouraged exercise.   Vaginal discharge/irritation- wet prep done today. Reassured normal PE today.  Bloating-improved with food elimination and probiotic.   See After Visit Summary for Counseling Recommendations

## 2016-04-23 ENCOUNTER — Encounter: Payer: Self-pay | Admitting: Neurology

## 2016-04-23 ENCOUNTER — Ambulatory Visit (INDEPENDENT_AMBULATORY_CARE_PROVIDER_SITE_OTHER): Payer: BC Managed Care – PPO | Admitting: Neurology

## 2016-04-23 VITALS — BP 140/80 | HR 76 | Resp 20 | Ht 68.0 in | Wt 215.0 lb

## 2016-04-23 DIAGNOSIS — H4710 Unspecified papilledema: Secondary | ICD-10-CM | POA: Diagnosis not present

## 2016-04-23 DIAGNOSIS — H53481 Generalized contraction of visual field, right eye: Secondary | ICD-10-CM

## 2016-04-23 DIAGNOSIS — G932 Benign intracranial hypertension: Secondary | ICD-10-CM

## 2016-04-23 DIAGNOSIS — R51 Headache: Secondary | ICD-10-CM | POA: Diagnosis not present

## 2016-04-23 DIAGNOSIS — R519 Headache, unspecified: Secondary | ICD-10-CM

## 2016-04-23 NOTE — Progress Notes (Addendum)
Subjective:    Patient ID: Yvonne Johnson is a 36 y.o. female.  HPI     Star Age, MD, PhD [facility] [address], [address] [place], [place] [address] Dear Dr. Marin Comment,   I saw your patient, Yvonne Johnson, upon your kind request in my neurologic clinic today for initial consultation of her bilateral papilledema and blurry vision, concern for underlying pseudotumor cerebri. The patient is unaccompanied today. As you know, Yvonne Johnson is a 36 year old right-handed woman with an underlying medical history of obesity who presented to your eye care center on 04/18/2016 with blurry vision and headaches. She had gone to urgent care recently with a sinus infection. She has a personal history of pseudotumor cerebri and a family history of pseudotumor as well. You noted slightly blurred disc margins right eye greater than left, lenses unremarkable to both eyes, no retinal problems, and corrected vision of 20/20 bilaterally. She reports a prior diagnosis of pseudotumor cerebri when she was 36 years old. She was incidentally found to have papilledema at the time of her original Dx. she was very athletic at this time, denies any headaches at the time. This time around, about 4 weeks ago she woke up with severe headache, since then she has had intermittent right-sided. Orbital and retro-orbital headaches. She snores some but intermittently and not loudly and her husband has not noticed any pauses in her breathing while she is asleep. She tries to rest enough, she has no significant recent increase in stressors, no recent medication changes, weight has been stable for the most part. She works full-time at [employer]. She has 4 children which includes a 80 year old son, 26 year old twin boys and an 80-year-old daughter. When she was first diagnosed with pseudotumor at age 89 she took Diamox, she took it off and on for years, has not taken it in the past 5-7 years. She had in  between her pregnancies some flareup of symptoms and took her Diamox at the time. She had workup and treatment previously at [facility]. Prior test results and records are not available for my review today. She reports that she had MRI testing at the time and a lumbar puncture which showed an abnormal pressure. She used to see ophthalmology in the past. Her headaches are new to her. She did not have any headaches before, she has no personal history of migraines. Her older sister also has pseudotumor cerebri. She has been taking ibuprofen on a nearly daily basis. She smokes 4 cigarettes per day on average, she drinks alcohol occasionally about twice per month and drinks one cup of coffee and one soda per day. She has not noticed any one-sided weakness, numbness, slurring of speech or droopy face.   Her Past Medical History Is Significant For: Past Medical History:  Diagnosis Date  . Pseudotumor cerebri     Her Past Surgical History Is Significant For: Past Surgical History:  Procedure Laterality Date  . CESAREAN SECTION     x 3    Her Family History Is Significant For: Family History  Problem Relation Age of Onset  . Arthritis Other   . Hyperlipidemia Other   . Hypertension Other   . Thyroid disease Other   . Diabetes Other   . Cancer Maternal Aunt     cervical or ovarian  . Hypertension Maternal Aunt   . Cancer Paternal Aunt     breast  . Hypertension Paternal Aunt   . Hypertension Mother   .  Hypothyroidism Mother   . Hypertension Father   . Hypertension Sister   . Hypothyroidism Sister   . Hypertension Maternal Grandmother   . Miscarriages / Stillbirths Maternal Grandmother   . Hypertension Maternal Grandfather   . Diabetes Paternal Grandmother   . Hypertension Paternal Grandmother   . Heart attack Paternal Grandfather   . Hypertension Paternal Grandfather   . Diabetes Paternal Grandfather   . Ovarian cancer Maternal Aunt 62    Her Social History Is Significant  For: Social History   Social History  . Marital status: Married    Spouse name: N/A  . Number of children: N/A  . Years of education: N/A   Occupational History  . [place] History Main Topics  . Smoking status: Never Smoker  . Smokeless tobacco: Never Used  . Alcohol use Yes  . Drug use: No  . Sexual activity: Yes    Birth control/ protection: None   Other Topics Concern  . None   Social History Narrative   Regular Exercise -  YES          Her Allergies Are:  Allergies  Allergen Reactions  . Penicillins     REACTION: hives  :   Her Current Medications Are:  Outpatient Encounter Prescriptions as of 04/23/2016  Medication Sig  . acyclovir (ZOVIRAX) 400 MG tablet Take 400 mg by mouth daily.  . fluticasone (FLONASE) 50 MCG/ACT nasal spray   . ibuprofen (ADVIL,MOTRIN) 200 MG tablet Take 200 mg by mouth daily as needed.   No facility-administered encounter medications on file as of 04/23/2016.   :   Review of Systems:  Out of a complete 14 point review of systems, all are reviewed and negative with the exception of these symptoms as listed below:  Review of Systems  Neurological:       Pt presents today to discuss swelling of optic nerve with headaches and a history of pseudotumor cerebri. Pt had an onset of headaches with a cold recently and the headaches did not resolve. Pt saw her eye doctor who noted optic nerve inflammation.    Objective:  Neurologic Exam  Physical Exam Physical Examination:   Vitals:   04/23/16 1504  BP: 140/80  Pulse: 76  Resp: 20    General Examination: The patient is a very pleasant 36 y.o. female in no acute distress. She appears well-developed and well-nourished and very well groomed.   HEENT: Normocephalic, atraumatic, pupils are equal, round and reactive to light and accommodation. Tympanic membranes are clear bilaterally. Funduscopic exam is normal with sharp disc margins noted on the left, mild  blurriness noted on the right, visual field testing with finger perimetry slightly off in the temporal visual field on the right only. Extraocular tracking is good without limitation to gaze excursion or nystagmus noted. Normal smooth pursuit is noted. Hearing is grossly intact. Face is symmetric with normal facial animation and normal facial sensation. Speech is clear with no dysarthria noted. There is no hypophonia. There is no lip, neck/head, jaw or voice tremor. Neck is supple with full range of passive and active motion. There are no carotid bruits on auscultation. Oropharynx exam reveals: mild mouth dryness, good dental hygiene and mild airway crowding, due to smaller airway entry, tonsils are 1+ bilaterally, uvula slightly larger and tongue wider. Mallampati is class II. Tongue protrudes centrally and palate elevates symmetrically.   Chest: Clear to auscultation without wheezing, rhonchi or crackles noted.  Heart: S1+S2+0, regular  and normal without murmurs, rubs or gallops noted.   Abdomen: Soft, non-tender and non-distended with normal bowel sounds appreciated on auscultation.  Extremities: There is no pitting edema in the distal lower extremities bilaterally. Pedal pulses are intact.  Skin: Warm and dry without trophic changes noted. There are no varicose veins.  Musculoskeletal: exam reveals no obvious joint deformities, tenderness or joint swelling or erythema.   Neurologically:  Mental status: The patient is awake, alert and oriented in all 4 spheres. Her immediate and remote memory, attention, language skills and fund of knowledge are appropriate. There is no evidence of aphasia, agnosia, apraxia or anomia. Speech is clear with normal prosody and enunciation. Thought process is linear. Mood is normal and affect is normal.  Cranial nerves II - XII are as described above under HEENT exam. In addition: shoulder shrug is normal with equal shoulder height noted. Motor exam: Normal bulk,  strength and tone is noted. There is no drift, tremor or rebound. Romberg is negative. Reflexes are 2+ throughout. Babinski: Toes are flexor bilaterally. Fine motor skills and coordination: intact with normal finger taps, normal hand movements, normal rapid alternating patting, normal foot taps and normal foot agility.  Cerebellar testing: No dysmetria or intention tremor on finger to nose testing. Heel to shin is unremarkable bilaterally. There is no truncal or gait ataxia.  Sensory exam: intact to light touch, pinprick, vibration, temperature sense in the upper and lower extremities.  Gait, station and balance: She stands easily. No veering to one side is noted. No leaning to one side is noted. Posture is age-appropriate and stance is narrow based. Gait shows normal stride length and normal pace. No problems turning are noted. Tandem walk is unremarkable.    Assessment and plan:    In summary, Yvonne Johnson is a very pleasant 36 y.o.-year old female with a history of Obesity and a prior diagnosis of pseudotumor cerebri at age 48, who presents with a history and physical exam concerning for recurrence of pseudotumor cerebri. She presents this time around with new onset headache for the past month which has not completely subsided. She does snore and has a mildly crowded appearing airway, no significant other telltale symptoms of obstructive sleep apnea but is encouraged to quit her husband regarding her snoring and breathing pauses, we may have a lower threshold to proceed with sleep study testing. For now, would like to order a spinal tap under fluoroscopic guidance to look for opening and closing pressure and routine spinal fluid testing. If her opening pressure is indeed elevated I would like for her to restart Diamox, first at 250 mg once twice daily then increase it to 500 mg twice daily, I will provide a prescription at the time. We will also proceed with brain MRI testing because she now  presents with headaches that she did not typically have before. These are primarily right-sided. She has a mildly abnormal visual field exam by my course testing. I would like for her to have a formal visual field testing and also follow with ophthalmology. I made a referral in that regard. We will call her with her test results, I would like to see her back after testing is completed. Thankfully, otherwise her neurological exam is nonfocal and she is reassured in that regard. I answered all her questions today and she was in agreement. Thank you very much for allowing me to participate in the care of this nice patient. If I can be of any further assistance to  you please do not hesitate to call me at [phone].  Sincerely,   Star Age, MD, PhD

## 2016-04-23 NOTE — Patient Instructions (Signed)
We will do a brain scan, called MRI and call you with the test results. We will have to schedule you for this on a separate date. This test requires authorization from your insurance, and we will take care of the insurance process.  Please ask your husband if you snore and if so, how loud it is, and if you have breathing related issues in your sleep, such as: snorting sounds, choking sounds, pauses in your breathing or shallow breathing events. These may be symptoms of obstructive sleep apnea (OSA).   You may have a recurrence of a condition called pseudotumor cerebri, which means that there increased fluid pressure around your brain. 1. We will add a brain MRI 2. We will get a formal eye exam with ophthalmology. 3. We will do a LP with pressure testing and routine fluid testing.  4. We may consider a medication to help keep your spinal fluid pressure at bay, ie diamox.  The most serious complication of having pseudotumor cerebri is loss of vision which can be permanent.

## 2016-04-27 ENCOUNTER — Telehealth: Payer: Self-pay | Admitting: Neurology

## 2016-04-27 ENCOUNTER — Encounter: Payer: Self-pay | Admitting: Neurology

## 2016-04-27 DIAGNOSIS — G932 Benign intracranial hypertension: Secondary | ICD-10-CM

## 2016-04-27 MED ORDER — ACETAZOLAMIDE 250 MG PO TABS: ORAL_TABLET | ORAL | 5 refills | Status: DC

## 2016-04-27 NOTE — Telephone Encounter (Signed)
Per email conversation, will start diamox. No action required, notified pt via email.

## 2016-05-06 ENCOUNTER — Ambulatory Visit (INDEPENDENT_AMBULATORY_CARE_PROVIDER_SITE_OTHER): Payer: BC Managed Care – PPO

## 2016-05-06 DIAGNOSIS — R519 Headache, unspecified: Secondary | ICD-10-CM

## 2016-05-06 DIAGNOSIS — H4710 Unspecified papilledema: Secondary | ICD-10-CM | POA: Diagnosis not present

## 2016-05-06 DIAGNOSIS — G932 Benign intracranial hypertension: Secondary | ICD-10-CM | POA: Diagnosis not present

## 2016-05-06 DIAGNOSIS — R51 Headache: Secondary | ICD-10-CM

## 2016-05-06 MED ORDER — GADOPENTETATE DIMEGLUMINE 469.01 MG/ML IV SOLN: 20.0000 mL | Freq: Once | INTRAVENOUS | Status: DC | PRN

## 2016-05-07 NOTE — Progress Notes (Signed)
Please call patient regarding her recent brain MRI from 05/06/2016 with and without contrast. Brain looks normal and findings in keeping with what we tend to see in patients with normal pressure hydrocephalus or pseudotumor cerebri. This is in keeping with her history and symptoms and her prior Dx.  Otherwise, very mild signs of chronic sinus disease, nothing acute looking. Overall normal enhancement pattern with contrast. No acute findings.  Star Age, MD, PhD [facility])

## 2016-05-08 ENCOUNTER — Ambulatory Visit
Admission: RE | Admit: 2016-05-08 | Discharge: 2016-05-08 | Disposition: A | Discharge status: Home / Self Care | Payer: BC Managed Care – PPO | Source: Ambulatory Visit | Attending: Neurology | Admitting: Neurology

## 2016-05-08 ENCOUNTER — Telehealth: Payer: Self-pay

## 2016-05-08 DIAGNOSIS — G932 Benign intracranial hypertension: Secondary | ICD-10-CM

## 2016-05-08 DIAGNOSIS — R51 Headache: Principal | ICD-10-CM

## 2016-05-08 DIAGNOSIS — R519 Headache, unspecified: Secondary | ICD-10-CM

## 2016-05-08 DIAGNOSIS — H4710 Unspecified papilledema: Secondary | ICD-10-CM

## 2016-05-08 LAB — CSF CELL COUNT WITH DIFFERENTIAL
RBC COUNT CSF: 0 {cells}/uL (ref 0–10)
WBC, CSF: 1 cells/uL (ref 0–5)

## 2016-05-08 LAB — GLUCOSE, CSF: GLUCOSE CSF: 57 mg/dL (ref 43–76)

## 2016-05-08 LAB — PROTEIN, CSF: TOTAL PROTEIN, CSF: 20 mg/dL (ref 15–45)

## 2016-05-08 NOTE — Discharge Instructions (Signed)

## 2016-05-08 NOTE — Telephone Encounter (Signed)
-----   Message from Star Age, MD sent at 05/07/2016  2:19 PM EST ----- Please call patient regarding her recent brain MRI from 05/06/2016 with and without contrast. Brain looks normal and findings in keeping with what we tend to see in patients with normal pressure hydrocephalus or pseudotumor cerebri. This is in keeping with her history and symptoms and her prior Dx.  Otherwise, very mild signs of chronic sinus disease, nothing acute looking. Overall normal enhancement pattern with contrast. No acute findings.  Star Age, MD, PhD [facility])

## 2016-05-08 NOTE — Telephone Encounter (Signed)
I spoke to patient and she is aware of results and recommendations.  

## 2016-05-11 NOTE — Progress Notes (Signed)
Please advise patient that the recent LP indeed showed elevated spinal fluid pressure. Normal routine test results thus far on the fluid that was sent off. Will need FU with ophthalmology on a regular basis. Continue with diamox titration and 500 mg bid as goal dose for now.  Star Age, MD, PhD [facility])

## 2016-05-12 ENCOUNTER — Telehealth: Payer: Self-pay

## 2016-05-12 LAB — CSF CULTURE
GRAM STAIN: NONE SEEN
ORGANISM ID, BACTERIA: NO GROWTH

## 2016-05-12 LAB — CSF CULTURE W GRAM STAIN: Gram Stain: NONE SEEN

## 2016-05-12 NOTE — Telephone Encounter (Signed)
I spoke to patient and gave results and recommendations below. She voiced understanding and will call back if any additional questions.

## 2016-05-12 NOTE — Telephone Encounter (Signed)
-----   Message from Star Age, MD sent at 05/11/2016  8:23 AM EDT ----- Please advise patient that the recent LP indeed showed elevated spinal fluid pressure. Normal routine test results thus far on the fluid that was sent off. Will need FU with ophthalmology on a regular basis. Continue with diamox titration and 500 mg bid as goal dose for now.  Star Age, MD, PhD [facility])

## 2016-05-14 ENCOUNTER — Encounter: Payer: Self-pay | Admitting: Neurology

## 2016-05-14 ENCOUNTER — Telehealth: Payer: Self-pay | Admitting: Neurology

## 2016-05-14 MED ORDER — ACETAZOLAMIDE ER 500 MG PO CP12: 500.0000 mg | ORAL_CAPSULE | Freq: Two times a day (BID) | ORAL | 3 refills | Status: DC

## 2016-05-14 NOTE — Telephone Encounter (Signed)
Per patient email switching to Diamox 500 mg ER (generic) 1 pill bid. Will email her back.

## 2016-05-15 ENCOUNTER — Telehealth: Payer: Self-pay | Admitting: Neurology

## 2016-05-15 DIAGNOSIS — G971 Other reaction to spinal and lumbar puncture: Secondary | ICD-10-CM

## 2016-05-15 NOTE — Telephone Encounter (Addendum)
Spoke with patient and relayed Dr Guadelupe Sabin message to her. She stated she called [facility] this morning and was told they "couldn't do anything and to call this office". She stated she had LP one week ago and was fine for a few days. She is now experiencing headaches, and today she also has nausea/vomiting today. She is asking what to do at this point, how long to wait until seeking treatment, what to expect. This RN advised will route her questions to the on call provider , Dr Jannifer Franklin. She verbalized understanding, appreciation. Routed with high priority.

## 2016-05-15 NOTE — Addendum Note (Signed)
Addended by: Margette Fast on: 05/15/2016 02:20 PM   Modules accepted: Orders

## 2016-05-15 NOTE — Telephone Encounter (Signed)
I called patient. The patient has had a spinal headache over the last 5 days. She is still on Diamox, I told her to hold this medication until the spinal headaches go away completely. I will get her set up for blood patch through [facility] imaging.

## 2016-05-15 NOTE — Telephone Encounter (Signed)
Pt called stating she is having spinal headache symptoms. She said when she is laying down she is okay but when she gets up she has a headache in front and back of head and also vomits with this type headache

## 2016-05-15 NOTE — Telephone Encounter (Signed)
Please call pt:  Unfortunately, there is not a whole I can suggest.  First thing to do is to drink plenty of fluid, some caffeine okay.  I would suggest, she call the radiology place - [facility] - where she had the LP, she may need to go in and have a blood patch.  If that is not possible, she will have to go to ER.

## 2016-05-17 DIAGNOSIS — H5213 Myopia, bilateral: Secondary | ICD-10-CM | POA: Diagnosis not present

## 2016-05-18 ENCOUNTER — Other Ambulatory Visit: Payer: Self-pay | Admitting: Neurology

## 2016-05-18 ENCOUNTER — Ambulatory Visit
Admission: RE | Admit: 2016-05-18 | Discharge: 2016-05-18 | Disposition: A | Discharge status: Home / Self Care | Payer: BC Managed Care – PPO | Source: Ambulatory Visit | Attending: Neurology | Admitting: Neurology

## 2016-05-18 DIAGNOSIS — G971 Other reaction to spinal and lumbar puncture: Secondary | ICD-10-CM

## 2016-05-18 MED ORDER — IOPAMIDOL (ISOVUE-M 200) INJECTION 41%: 1.0000 mL | Freq: Once | INTRAMUSCULAR | Status: DC

## 2016-05-18 NOTE — Progress Notes (Signed)
Pt states she already feels better.

## 2016-05-18 NOTE — Progress Notes (Signed)
20 cc's of blood obtained from left [facility] for blood patch. Site is unremarkable and pt tolerated well.

## 2016-05-18 NOTE — Discharge Instructions (Signed)

## 2016-07-22 ENCOUNTER — Ambulatory Visit (INDEPENDENT_AMBULATORY_CARE_PROVIDER_SITE_OTHER): Payer: BC Managed Care – PPO | Admitting: Neurology

## 2016-07-22 ENCOUNTER — Encounter: Payer: Self-pay | Admitting: Neurology

## 2016-07-22 VITALS — BP 108/72 | HR 82 | Resp 16 | Ht 68.0 in | Wt 216.0 lb

## 2016-07-22 DIAGNOSIS — G932 Benign intracranial hypertension: Secondary | ICD-10-CM | POA: Diagnosis not present

## 2016-07-22 NOTE — Patient Instructions (Addendum)
Exam looks good, I pleased to see you feel and do better.  We can see you in 6 months, after your next eye exam, you can see one of our nurse practitioners as you are stable. I will see you after that.

## 2016-07-22 NOTE — Progress Notes (Signed)
Subjective:    Patient ID: Yvonne Johnson is a 36 y.o. female.  HPI     Interim history:   Yvonne Johnson is a 36 year old right-handed woman with an underlying medical history of obesity who presents for follow-up consultation of her pseudotumor cerebri. The patient is unaccompanied today. I first met her on 04/23/2016 at the request of her optometrist, at which time she reported a prior diagnosis of pseudotumor cerebri, and she was found to have bilateral papilledema recently. She was originally diagnosed with pseudotumor cerebri when she was 36 years old. She had also experienced some weight gain with time. I suggested she restart taking Diamox. She called in the interim, requesting a change in her Diamox prescription for better tolerance. During her first visit, I suggested we proceed with MRI brain and spinal tap. She had a brain MRI with and without contrast on 05/06/2016 which I reviewed:IMPRESSION:  This MRI of the brain without contrast shows the following: 1.    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 Although these can be incidental findings, the combination is most consistent with elevated intracranial hypertension as would be seen with pseudotumor cerebri. 2.    Minimal mucoperiosteal thickening of the right maxillary sinus and ethmoid air cells consistent with minimal chronic sinusitis. 3.    There are no acute findings.    There is a normal enhancement pattern.   We called her with her test results.  She had a fluoroscopic-guided lumbar puncture on 05/08/2016: IMPRESSION: 1. Technically successful lumbar puncture under fluoroscopy. 2. Elevated opening pressure, 36 cm H2O.   We called her with her test results.  She called in the interim on 05/15/2016 for a five-day history of post LP headache. She talk to the on-call doctor at the time and was advised to proceed with a blood patch.  Today, 07/22/2016 (all dictated new, as well as above  notes, some dictation done in note pad or Word, outside of chart, may appear as copied):  She reports Doing much better, able to tolerate the new Diamox, improvement in her symptoms is quite significant, she is pleased with how she is doing. Unfortunately, needed a blood patch after her LP but other than that has been doing well. Had much better tolerance to the 500 mg Diamox capsules. Had to have blood patch 10 days after her LP, but improved. She has had improved blurry vision, HA, foggy feeling, tinnitus. [facility] saw her last week. Her eye exam is much improved, she does have a larger blind spot but this seems to be stable from before. I do not have actual records available to review today but she reports an improved eye exam and also improved eye symptoms, checkup scheduled for 01/14/2017.  The patient's allergies, current medications, family history, past medical history, past social history, past surgical history and problem list were reviewed and updated as appropriate.   Previously (copied from previous notes for reference):   04/23/2016: She presented to your eye care center on 04/18/2016 with blurry vision and headaches. She had gone to urgent care recently with a sinus infection. She has a personal history of pseudotumor cerebri and a family history of pseudotumor as well. You noted slightly blurred disc margins right eye greater than left, lenses unremarkable to both eyes, no retinal problems, and corrected vision of 20/20 bilaterally. She reports a prior diagnosis of pseudotumor cerebri when she was 36 years old. She was incidentally found to have papilledema  at the time of her original Dx. she was very athletic at this time, denies any headaches at the time. This time around, about 4 weeks ago she woke up with severe headache, since then she has had intermittent right-sided. Orbital and retro-orbital headaches. She snores some but intermittently and not loudly and her husband has not  noticed any pauses in her breathing while she is asleep. She tries to rest enough, she has no significant recent increase in stressors, no recent medication changes, weight has been stable for the most part. She works full-time at [employer]. She has 4 children which includes a 75 year old son, 79 year old twin boys and an 22-year-old daughter. When she was first diagnosed with pseudotumor at age 30 she took Diamox, she took it off and on for years, has not taken it in the past 5-7 years. She had in between her pregnancies some flareup of symptoms and took her Diamox at the time. She had workup and treatment previously at [facility]. Prior test results and records are not available for my review today. She reports that she had MRI testing at the time and a lumbar puncture which showed an abnormal pressure. She used to see ophthalmology in the past. Her headaches are new to her. She did not have any headaches before, she has no personal history of migraines. Her older sister also has pseudotumor cerebri. She has been taking ibuprofen on a nearly daily basis. She smokes 4 cigarettes per day on average, she drinks alcohol occasionally about twice per month and drinks one cup of coffee and one soda per day. She has not noticed any one-sided weakness, numbness, slurring of speech or droopy face.  Her Past Medical History Is Significant For: Past Medical History:  Diagnosis Date  . Pseudotumor cerebri     Her Past Surgical History Is Significant For: Past Surgical History:  Procedure Laterality Date  . CESAREAN SECTION     x 3    Her Family History Is Significant For: Family History  Problem Relation Age of Onset  . Arthritis Other   . Hyperlipidemia Other   . Hypertension Other   . Thyroid disease Other   . Diabetes Other   . Cancer Maternal Aunt        cervical or ovarian  . Hypertension Maternal Aunt   . Cancer Paternal Aunt        breast  . Hypertension Paternal Aunt   . Hypertension Mother    . Hypothyroidism Mother   . Hypertension Father   . Hypertension Sister   . Hypothyroidism Sister   . Hypertension Maternal Grandmother   . Miscarriages / Stillbirths Maternal Grandmother   . Hypertension Maternal Grandfather   . Diabetes Paternal Grandmother   . Hypertension Paternal Grandmother   . Heart attack Paternal Grandfather   . Hypertension Paternal Grandfather   . Diabetes Paternal Grandfather   . Ovarian cancer Maternal Aunt 62    Her Social History Is Significant For: Social History   Social History  . Marital status: Married    Spouse name: N/A  . Number of children: N/A  . Years of education: N/A   Occupational History  . [place] History Main Topics  . Smoking status: Never Smoker  . Smokeless tobacco: Never Used  . Alcohol use Yes  . Drug use: No  . Sexual activity: Yes    Birth control/ protection: None   Other Topics Concern  . None   Social  History Narrative   Regular Exercise -  YES          Her Allergies Are:  Allergies  Allergen Reactions  . Penicillins Hives  :   Her Current Medications Are:  Outpatient Encounter Prescriptions as of 07/22/2016  Medication Sig  . acetaZOLAMIDE (DIAMOX) 500 MG capsule Take 1 capsule (500 mg total) by mouth 2 (two) times daily.  Marland Kitchen acyclovir (ZOVIRAX) 400 MG tablet Take 400 mg by mouth daily.  . fluticasone (FLONASE) 50 MCG/ACT nasal spray   . ibuprofen (ADVIL,MOTRIN) 200 MG tablet Take 200 mg by mouth daily as needed.   Facility-Administered Encounter Medications as of 07/22/2016  Medication  . gadopentetate dimeglumine (MAGNEVIST) injection 20 mL  :  Review of Systems:  Out of a complete 14 point review of systems, all are reviewed and negative with the exception of these symptoms as listed below:  Review of Systems  Neurological:       No new concerns per patient.     Objective:  Neurologic Exam  Physical Exam Physical Examination:   Vitals:   07/22/16 1509  BP:  108/72  Pulse: 82  Resp: 16    General Examination: The patient is a very pleasant 36 y.o. female in no acute distress. She appears well-developed and well-nourished and well groomed. Good spirits.   HEENT: Normocephalic, atraumatic, pupils are equal, round and reactive to light and accommodation. Funduscopic exam is normal with sharp disc margins noted, no telltale papilledema. No photophobia. Extraocular tracking is good without limitation to gaze excursion or nystagmus noted. Normal smooth pursuit is noted. Hearing is grossly intact. Face is symmetric with normal facial animation and normal facial sensation. Speech is clear with no dysarthria noted. There is no hypophonia. There is no lip, neck/head, jaw or voice tremor. Neck is supple with full range of passive and active motion. There are no carotid bruits on auscultation. Oropharynx exam reveals: mild mouth dryness, good dental hygiene and mild airway crowding. Mallampati is class II. Tongue protrudes centrally and palate elevates symmetrically.   Chest: Clear to auscultation without wheezing, rhonchi or crackles noted.  Heart: S1+S2+0, regular and normal without murmurs, rubs or gallops noted.   Abdomen: Soft, normal bowel sounds.  Extremities: There is no pitting edema in the distal lower extremities bilaterally. Pedal pulses are intact.  Skin: Warm and dry without trophic changes noted. There are no varicose veins.  Musculoskeletal: exam reveals no obvious joint deformities, tenderness or joint swelling or erythema.   Neurologically:  Mental status: The patient is awake, alert and oriented in all 4 spheres. Her immediate and remote memory, attention, language skills and fund of knowledge are appropriate. There is no evidence of aphasia, agnosia, apraxia or anomia. Speech is clear with normal prosody and enunciation. Thought process is linear. Mood is normal and affect is normal.  Cranial nerves II - XII are as described above  under HEENT exam.  Motor exam: Normal bulk, strength and tone is noted. There is no drift, tremor or rebound. Romberg is negative. Reflexes are 2+ throughout. Fine motor skills and coordination: intact with normal finger taps, normal hand movements, normal rapid alternating patting, normal foot taps and normal foot agility.  Cerebellar testing: No dysmetria or intention tremor on finger to nose testing. Heel to shin is unremarkable bilaterally. There is no truncal or gait ataxia.  Sensory exam: intact to light touch in the upper and lower extremities.  Gait, station and balance: She stands easily. No veering to one side  is noted. No leaning to one side is noted. Posture is age-appropriate and stance is narrow based. Gait shows normal stride length and normal pace. No problems turning are noted. Tandem walk is unremarkable.    Assessment and Plan:    In summary, Kashae G Tsan is a very pleasant 36 year old female with a history of obesity and a prior diagnosis of pseudotumor cerebri at age 44, who presents for follow-up consultation of her pseudotumor cerebri with recurrence of symptoms in the recent past. She has had an improved eye exam recently. She will go back in 6 months for recheck. Thankfully, she has done well. Her symptoms of headache and blurry vision and tinnitus have improved. She had a spinal tap in March 2018 which did show an opening pressure abnormally elevated. Unfortunately, she suffered a post LP headache and needed a blood patch but eventually did well. She is currently on Diamox 500 mg twice daily with good results and good tolerance. We had to switch from Diamox tablets to capsules and she has been able to tolerate it ever since. We talked about her brain MRI results as well. She did have an empty sella phenomenon on the brain MRI, a finding that can be seen in patients with pseudotumor, not a definitive diagnostic sign but supportive. Otherwise her brain MRI was normal.  Her  exam looks good, I'm pleased to see that she feels well as well. I suggested a six-month checkup with one of her nurse practitioners. She did not need any refills on her Diamox prescription at this time. I answered all her questions today and she was in agreement.  I spent 20 minutes in total face-to-face time with the patient, more than 50% of which was spent in counseling and coordination of care, reviewing test results, reviewing medication and discussing or reviewing the diagnosis of PTC, its prognosis and treatment options. Pertinent laboratory and imaging test results that were available during this visit with the patient were reviewed by me and considered in my medical decision making (see chart for details).

## 2016-12-25 ENCOUNTER — Encounter: Payer: Self-pay | Admitting: Physician Assistant

## 2016-12-25 ENCOUNTER — Ambulatory Visit (INDEPENDENT_AMBULATORY_CARE_PROVIDER_SITE_OTHER): Payer: BC Managed Care – PPO | Admitting: Physician Assistant

## 2016-12-25 ENCOUNTER — Other Ambulatory Visit (HOSPITAL_COMMUNITY)
Admission: RE | Admit: 2016-12-25 | Discharge: 2016-12-25 | Disposition: A | Discharge status: Home / Self Care | Payer: BC Managed Care – PPO | Source: Ambulatory Visit | Attending: Physician Assistant | Admitting: Physician Assistant

## 2016-12-25 VITALS — BP 120/82 | HR 74 | Ht 68.0 in | Wt 215.0 lb

## 2016-12-25 DIAGNOSIS — R140 Abdominal distension (gaseous): Secondary | ICD-10-CM | POA: Diagnosis not present

## 2016-12-25 DIAGNOSIS — E6609 Other obesity due to excess calories: Secondary | ICD-10-CM | POA: Diagnosis not present

## 2016-12-25 DIAGNOSIS — B009 Herpesviral infection, unspecified: Secondary | ICD-10-CM | POA: Insufficient documentation

## 2016-12-25 DIAGNOSIS — Z0000 Encounter for general adult medical examination without abnormal findings: Secondary | ICD-10-CM | POA: Insufficient documentation

## 2016-12-25 DIAGNOSIS — Z6832 Body mass index (BMI) 32.0-32.9, adult: Secondary | ICD-10-CM | POA: Insufficient documentation

## 2016-12-25 DIAGNOSIS — Z124 Encounter for screening for malignant neoplasm of cervix: Secondary | ICD-10-CM | POA: Diagnosis not present

## 2016-12-25 DIAGNOSIS — Z23 Encounter for immunization: Secondary | ICD-10-CM

## 2016-12-25 MED ORDER — ACYCLOVIR 400 MG PO TABS: 400.0000 mg | ORAL_TABLET | Freq: Four times a day (QID) | ORAL | 1 refills | Status: DC

## 2016-12-25 NOTE — Patient Instructions (Signed)
.Discussed low carb diet with 1500 calories and 80g of protein.  Exercising at least 150 minutes a week.  My Fitness Pal could be a Microbiologist.    Keeping You Healthy  Get These Tests 1. Blood Pressure- Have your blood pressure checked once a year by your health care provider.  Normal blood pressure is 120/80. 2. Weight- Have your body mass index (BMI) calculated to screen for obesity.  BMI is measure of body fat based on height and weight.  You can also calculate your own BMI at GravelBags.it. 3. Cholesterol- Have your cholesterol checked every 5 years starting at age 38 then yearly starting at age 38. 79. Chlamydia, HIV, and other sexually transmitted diseases- Get screened every year until age 38, then within three months of each new sexual provider. 5. Pap Test - Every 1-5 years; discuss with your health care provider. 6. Mammogram- Every 1-2 years starting at age 70--50  Take these medicines  Calcium with Vitamin D-Your body needs 1200 mg of Calcium each day and [phone] IU of Vitamin D daily.  Your body can only absorb 500 mg of Calcium at a time so Calcium must be taken in 2 or 3 divided doses throughout the day.  Multivitamin with folic acid- Once daily if it is possible for you to become pregnant.  Get these Immunizations  Gardasil-Series of three doses; prevents HPV related illness such as genital warts and cervical cancer.  Menactra-Single dose; prevents meningitis.  Tetanus shot- Every 10 years.  Flu shot-Every year.  Take these steps 1. Do not smoke-Your healthcare provider can help you quit.  For tips on how to quit go to www.smokefree.gov or call 1-800 QUITNOW. 2. Be physically active- Exercise 5 days a week for at least 30 minutes.  If you are not already physically active, start slow and gradually work up to 30 minutes of moderate physical activity.  Examples of moderate activity include walking briskly, dancing, swimming, bicycling, etc. 3. Breast  Cancer- A self breast exam every month is important for early detection of breast cancer.  For more information and instruction on self breast exams, ask your healthcare provider or https://www.patel.info/. 4. Eat a healthy diet- Eat a variety of healthy foods such as fruits, vegetables, whole grains, low fat milk, low fat cheeses, yogurt, lean meats, poultry and fish, beans, nuts, tofu, etc.  For more information go to www. Thenutritionsource.org 5. Drink alcohol in moderation- Limit alcohol intake to one drink or less per day. Never drink and drive. 6. Depression- Your emotional health is as important as your physical health.  If you're feeling down or losing interest in things you normally enjoy please talk to your healthcare provider about being screened for depression. 7. Dental visit- Brush and floss your teeth twice daily; visit your dentist twice a year. 8. Eye doctor- Get an eye exam at least every 2 years. 9. Helmet use- Always wear a helmet when riding a bicycle, motorcycle, rollerblading or skateboarding. 50. Safe sex- If you may be exposed to sexually transmitted infections, use a condom. 11. Seat belts- Seat belts can save your live; always wear one. 12. Smoke/Carbon Monoxide detectors- These detectors need to be installed on the appropriate level of your home. Replace batteries at least once a year. 13. Skin cancer- When out in the sun please cover up and use sunscreen 15 SPF or higher. 14. Violence- If anyone is threatening or hurting you, please tell your healthcare provider.

## 2016-12-25 NOTE — Progress Notes (Signed)
Subjective:    Patient ID: Yvonne Johnson, female    DOB: 1980-10-20, 36 y.o.   MRN: [id number]  HPI   Review of Systems     Objective:   Physical Exam        Assessment & Plan:   Subjective:     Yvonne Johnson is a 36 y.o. female and is here for a comprehensive physical exam. The patient reports no problems.  Social History   Social History  . Marital status: Married    Spouse name: N/A  . Number of children: N/A  . Years of education: N/A   Occupational History  . [place] History Main Topics  . Smoking status: Never Smoker  . Smokeless tobacco: Never Used  . Alcohol use Yes  . Drug use: No  . Sexual activity: Yes    Birth control/ protection: None   Other Topics Concern  . Not on file   Social History Narrative   Regular Exercise -  YES         Health Maintenance  Topic Date Due  . HIV Screening  07/05/1993  . PAP SMEAR  11/24/2016  . TETANUS/TDAP  06/27/2019  . INFLUENZA VACCINE  Completed    The following portions of the patient's history were reviewed and updated as appropriate: allergies, current medications, past family history, past medical history, past social history, past surgical history and problem list.  Review of Systems A comprehensive review of systems was negative.   Objective:    BP 120/82   Pulse 74   Ht 5\' 8"  (1.727 m)   Wt 215 lb (97.5 kg)   BMI 32.69 kg/m  General appearance: alert, cooperative, appears stated age and mildly obese Head: Normocephalic, without obvious abnormality, atraumatic Eyes: conjunctivae/corneas clear. PERRL, EOM's intact. Fundi benign. Ears: normal TM's and external ear canals both ears Nose: Nares normal. Septum midline. Mucosa normal. No drainage or sinus tenderness. Throat: lips, mucosa, and tongue normal; teeth and gums normal Neck: no adenopathy, no carotid bruit, no JVD, supple, symmetrical, trachea midline and thyroid not enlarged, symmetric, no  tenderness/mass/nodules Back: symmetric, no curvature. ROM normal. No CVA tenderness. Lungs: clear to auscultation bilaterally Breasts: normal appearance, no masses or tenderness Heart: regular rate and rhythm, S1, S2 normal, no murmur, click, rub or gallop Abdomen: soft, non-tender; bowel sounds normal; no masses,  no organomegaly Pelvic: cervix normal in appearance, external genitalia normal, no adnexal masses or tenderness, no cervical motion tenderness, uterus normal size, shape, and consistency and vagina normal without discharge Extremities: extremities normal, atraumatic, no cyanosis or edema Pulses: 2+ and symmetric Skin: Skin color, texture, turgor normal. No rashes or lesions Lymph nodes: Cervical, supraclavicular, and axillary nodes normal. Neurologic: Alert and oriented X 3, normal strength and tone. Normal symmetric reflexes. Normal coordination and gait    Assessment:    Healthy female exam.      Plan:    Marland KitchenMarland KitchenClaryce was seen today for annual exam.  Diagnoses and all orders for this visit:  Routine physical examination -     COMPLETE METABOLIC PANEL WITH GFR -     Lipid Panel w/reflex Direct LDL -     TSH -     CBC with Differential/Platelet  Need for immunization against influenza -     Cancel: Flu Vaccine QUAD 36+ mos IM  Cervical cancer screening -     Cytology - PAP  Class 1 obesity due to excess calories without serious  comorbidity with body mass index (BMI) of 32.0 to 32.9 in adult  Abdominal bloating  Herpes simplex -     acyclovir (ZOVIRAX) 400 MG tablet; Take 1 tablet (400 mg total) by mouth 4 (four) times daily. For 7 days.   .. Depression screen [facility] 2/9 12/25/2016  Decreased Interest 0  Down, Depressed, Hopeless 0  PHQ - 2 Score 0   Pap done today.  Not yet due for mammograms.  Pt declined STD testing.   Discussed 150 minutes of exercise a week.  Encouraged vitamin D 1000 units and Calcium 1300mg  or 4 servings of dairy a day.  Marland Kitchen.Discussed  low carb diet with 1500 calories and 80g of protein.  Exercising at least 150 minutes a week.  [employer] could be a Microbiologist.   Pt is controlling her abdominal bloating with eliminating dairy and sodas.   She needs refills for outbreaks of herpes. She has 1-3 a year.  See After Visit Summary for Counseling Recommendations

## 2016-12-26 LAB — LIPID PANEL W/REFLEX DIRECT LDL
Cholesterol: 168 mg/dL (ref <200)
HDL: 98 mg/dL (ref >50)
LDL Cholesterol (Calc): 53 mg/dL (calc)
NON-HDL CHOLESTEROL (CALC): 70 mg/dL (ref <130)
Total CHOL/HDL Ratio: 1.7 (calc) (ref <5.0)
Triglycerides: 88 mg/dL (ref <150)

## 2016-12-26 LAB — CBC WITH DIFFERENTIAL/PLATELET
Basophils Absolute: 29 cells/uL (ref 0–200)
Basophils Relative: 0.6 %
EOS PCT: 2.1 %
Eosinophils Absolute: 101 cells/uL (ref 15–500)
HEMATOCRIT: 38.2 % (ref 35.0–45.0)
HEMOGLOBIN: 13.2 g/dL (ref 11.7–15.5)
LYMPHS ABS: 2050 {cells}/uL (ref 850–3900)
MCH: 32.5 pg (ref 27.0–33.0)
MCHC: 34.6 g/dL (ref 32.0–36.0)
MCV: 94.1 fL (ref 80.0–100.0)
MONOS PCT: 8.5 %
MPV: 10.6 fL (ref 7.5–12.5)
NEUTROS ABS: 2213 {cells}/uL (ref 1500–7800)
Neutrophils Relative %: 46.1 %
Platelets: 241 10*3/uL (ref 140–400)
RBC: 4.06 10*6/uL (ref 3.80–5.10)
RDW: 12.2 % (ref 11.0–15.0)
Total Lymphocyte: 42.7 %
WBC mixed population: 408 cells/uL (ref 200–950)
WBC: 4.8 10*3/uL (ref 3.8–10.8)

## 2016-12-26 LAB — COMPLETE METABOLIC PANEL WITH GFR
AG Ratio: 1.3 (calc) (ref 1.0–2.5)
ALBUMIN MSPROF: 4.2 g/dL (ref 3.6–5.1)
ALKALINE PHOSPHATASE (APISO): 51 U/L (ref 33–115)
ALT: 9 U/L (ref 6–29)
AST: 12 U/L (ref 10–30)
BUN: 10 mg/dL (ref 7–25)
CHLORIDE: 108 mmol/L (ref 98–110)
CO2: 24 mmol/L (ref 20–32)
CREATININE: 1.03 mg/dL (ref 0.50–1.10)
Calcium: 9 mg/dL (ref 8.6–10.2)
GFR, Est African American: 80 mL/min/{1.73_m2} (ref >=60)
GFR, Est Non African American: 69 mL/min/{1.73_m2} (ref >=60)
GLUCOSE: 86 mg/dL (ref 65–139)
Globulin: 3.2 g/dL (calc) (ref 1.9–3.7)
POTASSIUM: 3.8 mmol/L (ref 3.5–5.3)
Sodium: 139 mmol/L (ref 135–146)
Total Bilirubin: 0.3 mg/dL (ref 0.2–1.2)
Total Protein: 7.4 g/dL (ref 6.1–8.1)

## 2016-12-26 LAB — TSH: TSH: 0.64 m[IU]/L

## 2016-12-27 ENCOUNTER — Encounter: Payer: Self-pay | Admitting: Physician Assistant

## 2016-12-28 ENCOUNTER — Encounter: Payer: Self-pay | Admitting: Physician Assistant

## 2016-12-29 NOTE — Telephone Encounter (Signed)
Amber, can we call and add free t4, free t3, and t3 uptake to previous labs? Thanks

## 2016-12-30 LAB — CYTOLOGY - PAP
DIAGNOSIS: NEGATIVE
HPV (WINDOPATH): NOT DETECTED

## 2016-12-30 NOTE — Progress Notes (Signed)
Please add free T4, free T3, T3 uptake

## 2016-12-30 NOTE — Progress Notes (Signed)
Pap: Negative for HPV. Normal cytology. Repeat pap in 3 years. Continue to come in for annual exam.

## 2017-01-26 ENCOUNTER — Ambulatory Visit: Payer: BC Managed Care – PPO | Admitting: Adult Health

## 2017-05-19 ENCOUNTER — Ambulatory Visit: Payer: Self-pay | Admitting: Physician Assistant

## 2017-05-28 ENCOUNTER — Ambulatory Visit: Payer: BC Managed Care – PPO | Admitting: Physician Assistant

## 2017-06-11 ENCOUNTER — Ambulatory Visit: Payer: BC Managed Care – PPO | Admitting: Physician Assistant

## 2017-07-12 ENCOUNTER — Encounter: Payer: Self-pay | Admitting: Gastroenterology

## 2017-09-03 ENCOUNTER — Encounter: Payer: Self-pay | Admitting: Gastroenterology

## 2017-09-03 ENCOUNTER — Other Ambulatory Visit: Payer: Self-pay | Admitting: Gastroenterology

## 2017-09-03 ENCOUNTER — Ambulatory Visit: Payer: BC Managed Care – PPO | Admitting: Gastroenterology

## 2017-09-03 ENCOUNTER — Other Ambulatory Visit (INDEPENDENT_AMBULATORY_CARE_PROVIDER_SITE_OTHER): Payer: BC Managed Care – PPO

## 2017-09-03 VITALS — BP 110/76 | HR 92 | Ht 66.5 in | Wt 213.4 lb

## 2017-09-03 DIAGNOSIS — R1031 Right lower quadrant pain: Secondary | ICD-10-CM | POA: Diagnosis not present

## 2017-09-03 DIAGNOSIS — Z8371 Family history of colonic polyps: Secondary | ICD-10-CM

## 2017-09-03 DIAGNOSIS — K5904 Chronic idiopathic constipation: Secondary | ICD-10-CM

## 2017-09-03 DIAGNOSIS — R140 Abdominal distension (gaseous): Secondary | ICD-10-CM | POA: Diagnosis not present

## 2017-09-03 DIAGNOSIS — Z83719 Family history of colon polyps, unspecified: Secondary | ICD-10-CM

## 2017-09-03 LAB — COMPREHENSIVE METABOLIC PANEL
ALK PHOS: 60 U/L (ref 39–117)
ALT: 10 U/L (ref 0–35)
AST: 12 U/L (ref 0–37)
Albumin: 4.1 g/dL (ref 3.5–5.2)
BILIRUBIN TOTAL: 0.2 mg/dL (ref 0.2–1.2)
BUN: 10 mg/dL (ref 6–23)
CO2: 23 meq/L (ref 19–32)
CREATININE: 0.81 mg/dL (ref 0.40–1.20)
Calcium: 9.2 mg/dL (ref 8.4–10.5)
Chloride: 110 mEq/L (ref 96–112)
GFR: 83.59 mL/min (ref >60.00)
GLUCOSE: 95 mg/dL (ref 70–99)
Potassium: 3.9 mEq/L (ref 3.5–5.1)
SODIUM: 140 meq/L (ref 135–145)
TOTAL PROTEIN: 7.4 g/dL (ref 6.0–8.3)

## 2017-09-03 LAB — CBC WITH DIFFERENTIAL/PLATELET
BASOS ABS: 0 10*3/uL (ref 0.0–0.1)
Basophils Relative: 0.5 % (ref 0.0–3.0)
Eosinophils Absolute: 0.1 10*3/uL (ref 0.0–0.7)
Eosinophils Relative: 2 % (ref 0.0–5.0)
HCT: 39.7 % (ref 36.0–46.0)
Hemoglobin: 13.5 g/dL (ref 12.0–15.0)
LYMPHS ABS: 1.8 10*3/uL (ref 0.7–4.0)
LYMPHS PCT: 46.4 % — AB (ref 12.0–46.0)
MCHC: 34 g/dL (ref 30.0–36.0)
MCV: 97.6 fl (ref 78.0–100.0)
MONOS PCT: 8.4 % (ref 3.0–12.0)
Monocytes Absolute: 0.3 10*3/uL (ref 0.1–1.0)
NEUTROS ABS: 1.7 10*3/uL (ref 1.4–7.7)
NEUTROS PCT: 42.7 % — AB (ref 43.0–77.0)
PLATELETS: 226 10*3/uL (ref 150.0–400.0)
RBC: 4.07 Mil/uL (ref 3.87–5.11)
RDW: 13.3 % (ref 11.5–15.5)
WBC: 3.9 10*3/uL — ABNORMAL LOW (ref 4.0–10.5)

## 2017-09-03 LAB — TSH: TSH: 1.45 u[IU]/mL (ref 0.35–4.50)

## 2017-09-03 MED ORDER — PEG-KCL-NACL-NASULF-NA ASC-C 140 G PO SOLR: 140.0000 g | ORAL | 0 refills | Status: DC

## 2017-09-03 NOTE — Patient Instructions (Addendum)
If you are age 39 or older, your body mass index should be between 23-30. Your Body mass index is 33.92 kg/m. If this is out of the aforementioned range listed, please consider follow up with your Primary Care Provider.  If you are age 14 or younger, your body mass index should be between 19-25. Your Body mass index is 33.92 kg/m. If this is out of the aformentioned range listed, please consider follow up with your Primary Care Provider.   You have been scheduled for a colonoscopy. Please follow written instructions given to you at your visit today.  Please pick up your prep supplies at the pharmacy within the next 1-3 days. If you use inhalers (even only as needed), please bring them with you on the day of your procedure. Your physician has requested that you go to www.startemmi.com and enter the access code given to you at your visit today. This web site gives a general overview about your procedure. However, you should still follow specific instructions given to you by our office regarding your preparation for the procedure.  Your provider has requested that you go to the basement level for lab work before leaving today. Press "B" on the elevator. The lab is located at the first door on the left as you exit the elevator.   It was a pleasure to meet you today!  Dr. Loletha Carrow

## 2017-09-03 NOTE — Progress Notes (Signed)
[facility] Consult Note:  History: Yvonne Johnson 09/03/2017  Referring physician: Donella Stade, PA-C  Reason for consult/chief complaint: Abdominal Pain (RUQ and RLQ pain x 2 months); Gas; Bloated; and Constipation (some better today)   Subjective  HPI:  This is a very pleasant 37 year old woman self-referred for worsening of constipation with some associated abdominal pain.  She reports having tended toward constipation for years, but for a couple of months earlier this year she was having more severe constipation along with lower abdominal bloating and distention as well as right-sided abdominal pain.  She took MiraLAX daily for about a month which did improve regularity but made her quite bloated and gassy.  After stopping it it seems like her bowel function was improved with significant relief of the constipation as well as the other symptoms.  She is still having the right lower quadrant pain but to a much lesser degree than before.  He recalls having had some lower abdominal pain years ago and had a pelvic ultrasound, which I was able to find a report of. Daveena denies rectal bleeding, nausea, vomiting, dysphagia, early satiety or weight loss.  ROS:  Review of Systems  Constitutional: Positive for fatigue. Negative for appetite change and unexpected weight change.       Subjective feeling of nighttime fever  HENT: Negative for mouth sores and voice change.   Eyes: Negative for pain and redness.  Respiratory: Negative for cough and shortness of breath.   Cardiovascular: Negative for chest pain and palpitations.  Genitourinary: Negative for dysuria and hematuria.  Musculoskeletal: Negative for arthralgias and myalgias.  Skin: Negative for pallor and rash.  Neurological: Negative for weakness and headaches.  Hematological: Negative for adenopathy.     Past Medical History: Past Medical History:  Diagnosis Date  . Pseudotumor cerebri       Past Surgical History: Past Surgical History:  Procedure Laterality Date  . CESAREAN SECTION     x 3     Family History: Family History  Problem Relation Age of Onset  . Hypertension Maternal Aunt   . Ovarian cancer Maternal Aunt   . Hypertension Paternal Aunt   . Breast cancer Paternal Aunt   . Hypertension Mother   . Hypothyroidism Mother   . Arthritis Mother   . Hypertension Father   . Colon polyps Father   . Diabetes Father   . Hyperlipidemia Father   . Hypertension Sister   . Hypothyroidism Sister   . Hypertension Maternal Grandmother   . Miscarriages / Stillbirths Maternal Grandmother   . Hypertension Maternal Grandfather   . Liver disease Maternal Grandfather   . Diabetes Paternal Grandmother   . Hypertension Paternal Grandmother   . Colon cancer Paternal Grandmother   . Heart attack Paternal Grandfather   . Hypertension Paternal Grandfather   . Diabetes Paternal Grandfather   . Kidney disease Paternal Aunt   . Breast cancer Paternal Aunt    Father with multiple large polyps - " a ton of them"  Social History: Social History   Socioeconomic History  . Marital status: Married    Spouse name: Not on file  . Number of children: 4  . Years of education: Not on file  . Highest education level: Not on file  Occupational History  . Occupation: [employer]  . Financial resource strain: Not on file  . Food insecurity:    Worry: Not on file    Inability: Not on file  .  Transportation needs:    Medical: Not on file    Non-medical: Not on file  Tobacco Use  . Smoking status: Never Smoker  . Smokeless tobacco: Never Used  Substance and Sexual Activity  . Alcohol use: Yes    Comment: soacial  . Drug use: No  . Sexual activity: Yes    Birth control/protection: None  Lifestyle  . Physical activity:    Days per week: Not on file    Minutes per session: Not on file  . Stress: Not on file  Relationships  . Social connections:     Talks on phone: Not on file    Gets together: Not on file    Attends religious service: Not on file    Active member of club or organization: Not on file    Attends meetings of clubs or organizations: Not on file    Relationship status: Not on file  Other Topics Concern  . Not on file  Social History Narrative   Regular Exercise -  YES          Allergies: Allergies  Allergen Reactions  . Penicillins Hives    Outpatient Meds: Current Outpatient Medications  Medication Sig Dispense Refill  . acetaZOLAMIDE (DIAMOX) 500 MG capsule Take 1 capsule (500 mg total) by mouth 2 (two) times daily. 180 capsule 3  . acyclovir (ZOVIRAX) 400 MG tablet Take 1 tablet (400 mg total) by mouth 4 (four) times daily. For 7 days. 28 tablet 1  . fluticasone (FLONASE) 50 MCG/ACT nasal spray     . ibuprofen (ADVIL,MOTRIN) 200 MG tablet Take 200 mg by mouth daily as needed.    Marland Kitchen PEG-KCl-NaCl-NaSulf-Na Asc-C (PLENVU) 140 g SOLR Take 140 g by mouth as directed. 1 each 0   No current facility-administered medications for this visit.    Facility-Administered Medications Ordered in Other Visits  Medication Dose Route Frequency Provider Last Rate Last Dose  . gadopentetate dimeglumine (MAGNEVIST) injection 20 mL  20 mL Intravenous Once PRN Star Age, MD          ___________________________________________________________________ Objective   Exam:  BP 110/76 (BP Location: Left Arm, Patient Position: Sitting, Cuff Size: Normal)   Pulse 92   Ht 5' 6.5" (1.689 m) Comment: height measured without shoes  Wt 213 lb 6 oz (96.8 kg)   LMP 08/20/2017   BMI 33.92 kg/m    General: this is a(n) well-appearing woman  Eyes: sclera anicteric, no redness  ENT: oral mucosa moist without lesions, no cervical or supraclavicular lymphadenopathy, good dentition  CV: RRR without murmur, S1/S2, no JVD, no peripheral edema  Resp: clear to auscultation bilaterally, normal RR and effort noted  GI: soft, no  tenderness, with active bowel sounds. No guarding or palpable organomegaly noted.  Skin; warm and dry, no rash or jaundice noted  Neuro: awake, alert and oriented x 3. Normal gross motor function and fluent speech  Labs:  None  Radiologic Studies:  Pelvic US normal 06/2013  Assessment: Encounter Diagnoses  Name Primary?  . Chronic idiopathic constipation Yes  . Abdominal bloating   . RLQ abdominal pain   . Family history of colonic polyps     It is not clear why she had a worsening of the constipation that then improved after about a month of regular MiraLAX dosing.  The abdominal pain and bloating seem to improve with that but not completely resolved.  While her father did not have colon cancer, he had what sounds like multiple advanced polyps  and his mother had colon cancer.  For all these reasons, I have advised Claryceto have a colonoscopy, and she is agreeable after discussion of procedure and risks  Meanwhile, she will continue her current diet of high-fiber, plenty of water, and physical exercise to maintain regularity and as needed dosing of MiraLAX.  Thank you for the courtesy of this consult.  Please call me with any questions or concerns.  Nelida Meuse III  CC: Donella Stade, PA-C

## 2017-09-06 ENCOUNTER — Encounter: Payer: Self-pay | Admitting: Gastroenterology

## 2017-09-09 ENCOUNTER — Encounter: Payer: Self-pay | Admitting: Gastroenterology

## 2017-09-09 ENCOUNTER — Ambulatory Visit (AMBULATORY_SURGERY_CENTER): Payer: BC Managed Care – PPO | Admitting: Gastroenterology

## 2017-09-09 VITALS — BP 107/83 | HR 70 | Temp 98.7°F | Resp 11 | Ht 66.0 in | Wt 213.0 lb

## 2017-09-09 DIAGNOSIS — D122 Benign neoplasm of ascending colon: Secondary | ICD-10-CM | POA: Diagnosis not present

## 2017-09-09 DIAGNOSIS — R1031 Right lower quadrant pain: Secondary | ICD-10-CM | POA: Diagnosis not present

## 2017-09-09 DIAGNOSIS — K5909 Other constipation: Secondary | ICD-10-CM | POA: Diagnosis not present

## 2017-09-09 MED ORDER — SODIUM CHLORIDE 0.9 % IV SOLN: 500.0000 mL | Freq: Once | INTRAVENOUS | Status: DC

## 2017-09-09 NOTE — Progress Notes (Signed)
Called to room to assist during endoscopic procedure.  Patient ID and intended procedure confirmed with present staff. Received instructions for my participation in the procedure from the performing physician.  

## 2017-09-09 NOTE — Progress Notes (Signed)
Pt's states no medical or surgical changes since previsit or office visit. 

## 2017-09-09 NOTE — Patient Instructions (Signed)
YOU HAD AN ENDOSCOPIC PROCEDURE TODAY AT [facility]:   Refer to the procedure report that was given to you for any specific questions about what was found during the examination.  If the procedure report does not answer your questions, please call your gastroenterologist to clarify.  If you requested that your care partner not be given the details of your procedure findings, then the procedure report has been included in a sealed envelope for you to review at your convenience later.  YOU SHOULD EXPECT: Some feelings of bloating in the abdomen. Passage of more gas than usual.  Walking can help get rid of the air that was put into your GI tract during the procedure and reduce the bloating. If you had a lower endoscopy (such as a colonoscopy or flexible sigmoidoscopy) you may notice spotting of blood in your stool or on the toilet paper. If you underwent a bowel prep for your procedure, you may not have a normal bowel movement for a few days.  Please Note:  You might notice some irritation and congestion in your nose or some drainage.  This is from the oxygen used during your procedure.  There is no need for concern and it should clear up in a day or so.  SYMPTOMS TO REPORT IMMEDIATELY:   Following lower endoscopy (colonoscopy or flexible sigmoidoscopy):  Excessive amounts of blood in the stool  Significant tenderness or worsening of abdominal pains  Swelling of the abdomen that is new, acute  Fever of 100F or higher   For urgent or emergent issues, a gastroenterologist can be reached at any hour by calling [phone].   DIET:  We do recommend a small meal at first, but then you may proceed to your regular diet.  Drink plenty of fluids but you should avoid alcoholic beverages for 24 hours. Try to increase the fiber in your diet, and drink plenty of water.   Try not to get constipated.  Also, try to get plenty of exercise.  ACTIVITY:  You should plan to take it easy for the  rest of today and you should NOT DRIVE or use heavy machinery until tomorrow (because of the sedation medicines used during the test).    FOLLOW UP: Our staff will call the number listed on your records the next business day following your procedure to check on you and address any questions or concerns that you may have regarding the information given to you following your procedure. If we do not reach you, we will leave a message.  However, if you are feeling well and you are not experiencing any problems, there is no need to return our call.  We will assume that you have returned to your regular daily activities without incident.  If any biopsies were taken you will be contacted by phone or by letter within the next 1-3 weeks.  Please call us at [phone] if you have not heard about the biopsies in 3 weeks.    SIGNATURES/CONFIDENTIALITY: You and/or your care partner have signed paperwork which will be entered into your electronic medical record.  These signatures attest to the fact that that the information above on your After Visit Summary has been reviewed and is understood.  Full responsibility of the confidentiality of this discharge information lies with you and/or your care-partner.  Read all handouts given to you by your recovery room nurse.

## 2017-09-09 NOTE — Op Note (Signed)
[facility] Patient Name: Yvonne Johnson Procedure Date: 09/09/2017 2:00 PM MRN: [id number] Endoscopist: Mallie Mussel L. Loletha Carrow , MD Age: 37 Referring MD:  Date of Birth: [birth year] Gender: Female Account #: [id number] Procedure:                Colonoscopy Indications:              Abdominal pain in the right lower quadrant,                            Constipation Medicines:                Monitored Anesthesia Care Procedure:                Pre-Anesthesia Assessment:                           - Prior to the procedure, a History and Physical                            was performed, and patient medications and                            allergies were reviewed. The patient's tolerance of                            previous anesthesia was also reviewed. The risks                            and benefits of the procedure and the sedation                            options and risks were discussed with the patient.                            All questions were answered, and informed consent                            was obtained. Prior Anticoagulants: The patient has                            taken no previous anticoagulant or antiplatelet                            agents. ASA Grade Assessment: II - A patient with                            mild systemic disease. After reviewing the risks                            and benefits, the patient was deemed in                            satisfactory condition to undergo the procedure.  After obtaining informed consent, the colonoscope                            was passed under direct vision. Throughout the                            procedure, the patient's blood pressure, pulse, and                            oxygen saturations were monitored continuously. The                            Model PCF-[id number]) scope was introduced                            through the anus and advanced to the the cecum,                             identified by appendiceal orifice and ileocecal                            valve. The colonoscopy was performed with                            difficulty due to a redundant colon and a tortuous                            colon. The patient tolerated the procedure well.                            The quality of the bowel preparation was good. The                            ileocecal valve, appendiceal orifice, and rectum                            were photographed. The quality of the bowel                            preparation was evaluated using the BBPS [facility]                            Bowel Preparation Scale) with scores of: Right                            Colon = 2, Transverse Colon = 2 and Left Colon = 2.                            The total BBPS score equals 6. Scope In: 2:07:14 PM Scope Out: 2:25:48 PM Scope Withdrawal Time: 0 hours 14 minutes 20 seconds  Total Procedure Duration: 0 hours 18 minutes 34 seconds  Findings:  The perianal and digital rectal examinations were                            normal.                           The sigmoid colon was moderately redundant and                            tortuous.                           A 8 mm polyp was found in the proximal ascending                            colon. The polyp was sessile. The polyp was removed                            with a cold snare. Resection was complete, but the                            polyp tissue was not retrieved.                           The exam was otherwise without abnormality on                            direct and retroflexion views. Complications:            No immediate complications. Estimated Blood Loss:     Estimated blood loss was minimal. Impression:               - Redundant colon.                           - One 8 mm polyp in the proximal ascending colon,                            removed with a cold snare. Complete resection.                             Polyp tissue not retrieved.                           - The examination was otherwise normal on direct                            and retroflexion views. Recommendation:           - Patient has a contact number available for                            emergencies. The signs and symptoms of potential                            delayed complications were discussed with the  patient. Return to normal activities tomorrow.                            Written discharge instructions were provided to the                            patient.                           - Resume previous diet.                           - Continue present medications.                           - Repeat colonoscopy in 5 years for surveillance                            since polyp not retrieved and pathology unknown. Reubin Bushnell L. Loletha Carrow, MD 09/09/2017 2:30:22 PM This report has been signed electronically.

## 2017-09-09 NOTE — Progress Notes (Signed)
Report given to PACU, vss 

## 2017-09-10 ENCOUNTER — Telehealth: Payer: Self-pay

## 2017-09-10 NOTE — Telephone Encounter (Signed)
  Follow up Call-  Call back number 09/09/2017  Post procedure Call Back phone  # [phone]  Permission to leave phone message Yes  Some recent data might be hidden     Patient questions:  Do you have a fever, pain , or abdominal swelling? No. Pain Score  0 *  Have you tolerated food without any problems? Yes.    Have you been able to return to your normal activities? Yes.    Do you have any questions about your discharge instructions: Diet   No. Medications  No. Follow up visit  No.  Do you have questions or concerns about your Care? No.  Actions: * If pain score is 4 or above: No action needed, pain <4.

## 2017-12-03 ENCOUNTER — Encounter: Payer: Self-pay | Admitting: Nurse Practitioner

## 2017-12-03 ENCOUNTER — Ambulatory Visit (INDEPENDENT_AMBULATORY_CARE_PROVIDER_SITE_OTHER): Payer: BC Managed Care – PPO | Admitting: Nurse Practitioner

## 2017-12-03 ENCOUNTER — Encounter: Payer: BC Managed Care – PPO | Admitting: Physician Assistant

## 2017-12-03 VITALS — BP 112/76 | HR 75 | Temp 98.2°F | Ht 68.0 in | Wt 213.0 lb

## 2017-12-03 DIAGNOSIS — B009 Herpesviral infection, unspecified: Secondary | ICD-10-CM

## 2017-12-03 DIAGNOSIS — Z13220 Encounter for screening for lipoid disorders: Secondary | ICD-10-CM

## 2017-12-03 DIAGNOSIS — Z0000 Encounter for general adult medical examination without abnormal findings: Secondary | ICD-10-CM | POA: Diagnosis not present

## 2017-12-03 DIAGNOSIS — Z23 Encounter for immunization: Secondary | ICD-10-CM

## 2017-12-03 DIAGNOSIS — Z136 Encounter for screening for cardiovascular disorders: Secondary | ICD-10-CM

## 2017-12-03 DIAGNOSIS — K635 Polyp of colon: Secondary | ICD-10-CM | POA: Insufficient documentation

## 2017-12-03 DIAGNOSIS — Z114 Encounter for screening for human immunodeficiency virus [HIV]: Secondary | ICD-10-CM | POA: Diagnosis not present

## 2017-12-03 MED ORDER — ACYCLOVIR 400 MG PO TABS: 400.0000 mg | ORAL_TABLET | Freq: Four times a day (QID) | ORAL | 1 refills | Status: DC

## 2017-12-03 NOTE — Progress Notes (Signed)
Subjective:    Patient ID: Yvonne Johnson, female    DOB: Oct 18, 1980, 37 y.o.   MRN: [id number]  Patient presents today for complete physical   HPI   previous pcp last seen 10/24//2019.  Pseudotumor Cerebri: followed by [facility]: Dr. Rexene Alberts Last seen 11/2016 current use of diamox.  CBC, CMP, and TSH done 08/2017: normal  Sexual History (orientation,birth control, marital status, STD):married, sexually active, no contraception  Depression/Suicide: Depression screen [facility] 2/9 12/03/2017 12/25/2016  Decreased Interest 0 0  Down, Depressed, Hopeless 0 0  PHQ - 2 Score 0 0   Vision:up to date, done annually  Dental:up to date, done every 39months  Immunizations: (TDAP, Hep C screen, Pneumovax, Influenza, zoster)  Health Maintenance  Topic Date Due  . Tetanus Vaccine  06/27/2019  . Pap Smear  12/26/2019  . Colon Cancer Screening  09/10/2022  . Flu Shot  Completed  . HIV Screening  Completed   Diet:regular.  Weight:  Wt Readings from Last 3 Encounters:  12/03/17 213 lb (96.6 kg)  09/09/17 213 lb (96.6 kg)  09/03/17 213 lb 6 oz (96.8 kg)   Exercise:basketball 2x/week  Fall Risk: Fall Risk  12/03/2017  Falls in the past year? No   Medications and allergies reviewed with patient and updated if appropriate.  Patient Active Problem List   Diagnosis Date Noted  . Colon polyp 12/03/2017  . Class 1 obesity due to excess calories without serious comorbidity with body mass index (BMI) of 32.0 to 32.9 in adult 12/25/2016  . Herpes simplex 12/25/2016  . CN (constipation) 04/17/2014  . Generalized abdominal pain 04/17/2014  . Abdominal bloating 04/17/2014  . Nipple discharge 07/10/2013  . Bilateral low back pain 07/10/2013  . Abdominal pain, left lower quadrant 07/10/2013  . Weight loss, unintentional 07/10/2013  . Pseudotumor cerebri     Current Outpatient Medications on File Prior to Visit  Medication Sig Dispense Refill  . acetaZOLAMIDE (DIAMOX) 500 MG capsule Take 1  capsule (500 mg total) by mouth 2 (two) times daily. 180 capsule 3  . fluticasone (FLONASE) 50 MCG/ACT nasal spray     . ibuprofen (ADVIL,MOTRIN) 200 MG tablet Take 200 mg by mouth daily as needed.     Current Facility-Administered Medications on File Prior to Visit  Medication Dose Route Frequency Provider Last Rate Last Dose  . 0.9 %  sodium chloride infusion  500 mL Intravenous Once Nelida Meuse III, MD      . gadopentetate dimeglumine (MAGNEVIST) injection 20 mL  20 mL Intravenous Once PRN Star Age, MD        Past Medical History:  Diagnosis Date  . Pseudotumor cerebri     Past Surgical History:  Procedure Laterality Date  . CESAREAN SECTION     x 3    Social History   Socioeconomic History  . Marital status: Married    Spouse name: Not on file  . Number of children: 4  . Years of education: Not on file  . Highest education level: Not on file  Occupational History  . Occupation: [employer]  . Financial resource strain: Not on file  . Food insecurity:    Worry: Not on file    Inability: Not on file  . Transportation needs:    Medical: Not on file    Non-medical: Not on file  Tobacco Use  . Smoking status: Former Smoker    Types: Cigarettes    Last attempt to quit: 03/05/2003  Years since quitting: 14.7  . Smokeless tobacco: Never Used  Substance and Sexual Activity  . Alcohol use: Yes    Alcohol/week: 1.0 standard drinks    Types: 1 Glasses of wine per week    Comment: soacial  . Drug use: No  . Sexual activity: Yes    Birth control/protection: None  Lifestyle  . Physical activity:    Days per week: Not on file    Minutes per session: Not on file  . Stress: Not on file  Relationships  . Social connections:    Talks on phone: Not on file    Gets together: Not on file    Attends religious service: Not on file    Active member of club or organization: Not on file    Attends meetings of clubs or organizations: Not on file     Relationship status: Not on file  Other Topics Concern  . Not on file  Social History Narrative   Regular Exercise -  YES          Family History  Problem Relation Age of Onset  . Hypertension Maternal Aunt   . Ovarian cancer Maternal Aunt 83  . Breast cancer Maternal Aunt 60  . Hypothyroidism Maternal Aunt 45       thyroid nodules  . Hypertension Paternal Aunt   . Breast cancer Paternal Aunt 58  . Hypertension Mother   . Hypothyroidism Mother 43       thyroid nodules  . Arthritis Mother   . Hypertension Father   . Colon polyps Father   . Diabetes Father   . Hyperlipidemia Father   . Hypertension Sister   . Hypothyroidism Sister 38       thyroid nodules  . Hypertension Maternal Grandmother   . Miscarriages / Stillbirths Maternal Grandmother   . Hypertension Maternal Grandfather   . Liver disease Maternal Grandfather        liver cancer secondary to ETOH  . Alcohol abuse Maternal Grandfather   . Diabetes Paternal Grandmother   . Hypertension Paternal Grandmother   . Colon cancer Paternal Grandmother 27  . Heart attack Paternal Grandfather   . Hypertension Paternal Grandfather   . Heart disease Paternal Grandfather 50       MI/CAD  . Kidney disease Paternal Aunt        secondary to sarcoidosis        Review of Systems  Constitutional: Negative for fever, malaise/fatigue and weight loss.  HENT: Negative for congestion and sore throat.   Eyes:       Negative for visual changes  Respiratory: Negative for cough and shortness of breath.   Cardiovascular: Negative for chest pain, palpitations and leg swelling.  Gastrointestinal: Negative for blood in stool, constipation, diarrhea and heartburn.  Genitourinary: Negative for dysuria, frequency and urgency.  Musculoskeletal: Negative for falls, joint pain and myalgias.  Skin: Negative for rash.  Neurological: Negative for dizziness, sensory change and headaches.  Endo/Heme/Allergies: Does not bruise/bleed easily.    Psychiatric/Behavioral: Negative for depression, substance abuse and suicidal ideas. The patient is not nervous/anxious.     Objective:   Vitals:   12/03/17 1352  BP: 112/76  Pulse: 75  Temp: 98.2 F (36.8 C)  SpO2: 98%    Body mass index is 32.39 kg/m.   Physical Examination:  Physical Exam  Constitutional: She is oriented to person, place, and time. She appears well-developed and well-nourished.  HENT:  Right Ear: External ear normal.  Left Ear:  External ear normal.  Nose: Nose normal.  Mouth/Throat: Oropharynx is clear and moist. No oropharyngeal exudate.  Eyes: Pupils are equal, round, and reactive to light. Conjunctivae and EOM are normal.  Neck: Normal range of motion. Neck supple. No thyromegaly present.  Cardiovascular: Normal rate, regular rhythm and normal heart sounds.  Pulmonary/Chest: Effort normal and breath sounds normal. No respiratory distress. She exhibits no tenderness. Right breast exhibits no inverted nipple, no mass, no nipple discharge, no skin change and no tenderness. Left breast exhibits no inverted nipple, no mass, no nipple discharge, no skin change and no tenderness.  Abdominal: Soft. Bowel sounds are normal. She exhibits no distension. There is no tenderness.  Genitourinary:  Genitourinary Comments: declined  Musculoskeletal: Normal range of motion. She exhibits no edema.  Lymphadenopathy:    She has no cervical adenopathy.  Neurological: She is alert and oriented to person, place, and time. She has normal reflexes.  Skin: Skin is warm and dry. No rash noted.  Psychiatric: She has a normal mood and affect. Her behavior is normal. Thought content normal.  Vitals reviewed.   ASSESSMENT and PLAN:  Shruthi was seen today for establish care.  Diagnoses and all orders for this visit:  Preventative health care -     Cancel: Lipid panel -     Cancel: Lipid panel -     HIV Antibody (routine testing w rflx) -     Lipid panel  Encounter for  lipid screening for cardiovascular disease -     Cancel: Lipid panel -     Cancel: Lipid panel -     Lipid panel  Encounter for screening for HIV -     Cancel: HIV Antibody (routine testing w rflx) -     HIV Antibody (routine testing w rflx)  Need for influenza vaccination -     Flu Vaccine QUAD 6+ mos PF IM (Fluarix Quad PF)  Herpes simplex -     acyclovir (ZOVIRAX) 400 MG tablet; Take 1 tablet (400 mg total) by mouth 4 (four) times daily. For 7 days.   No problem-specific Assessment & Plan notes found for this encounter.     Follow up: Return if symptoms worsen or fail to improve.  Wilfred Lacy, NP

## 2017-12-03 NOTE — Patient Instructions (Addendum)
Let me know if palpitation becomes more consistent or associated with other symptoms.  Maintain DASH diet and regular exercise.  Health Maintenance, Female Adopting a healthy lifestyle and getting preventive care can go a long way to promote health and wellness. Talk with your health care provider about what schedule of regular examinations is right for you. This is a good chance for you to check in with your provider about disease prevention and staying healthy. In between checkups, there are plenty of things you can do on your own. Experts have done a lot of research about which lifestyle changes and preventive measures are most likely to keep you healthy. Ask your health care provider for more information. Weight and diet Eat a healthy diet  Be sure to include plenty of vegetables, fruits, low-fat dairy products, and lean protein.  Do not eat a lot of foods high in solid fats, added sugars, or salt.  Get regular exercise. This is one of the most important things you can do for your health. ? Most adults should exercise for at least 150 minutes each week. The exercise should increase your heart rate and make you sweat (moderate-intensity exercise). ? Most adults should also do strengthening exercises at least twice a week. This is in addition to the moderate-intensity exercise.  Maintain a healthy weight  Body mass index (BMI) is a measurement that can be used to identify possible weight problems. It estimates body fat based on height and weight. Your health care provider can help determine your BMI and help you achieve or maintain a healthy weight.  For females 18 years of age and older: ? A BMI below 18.5 is considered underweight. ? A BMI of 18.5 to 24.9 is normal. ? A BMI of 25 to 29.9 is considered overweight. ? A BMI of 30 and above is considered obese.  Watch levels of cholesterol and blood lipids  You should start having your blood tested for lipids and cholesterol at 39 years  of age, then have this test every 5 years.  You may need to have your cholesterol levels checked more often if: ? Your lipid or cholesterol levels are high. ? You are older than 39 years of age. ? You are at high risk for heart disease.  Cancer screening Lung Cancer  Lung cancer screening is recommended for adults 91-53 years old who are at high risk for lung cancer because of a history of smoking.  A yearly low-dose CT scan of the lungs is recommended for people who: ? Currently smoke. ? Have quit within the past 15 years. ? Have at least a 30-pack-year history of smoking. A pack year is smoking an average of one pack of cigarettes a day for 1 year.  Yearly screening should continue until it has been 15 years since you quit.  Yearly screening should stop if you develop a health problem that would prevent you from having lung cancer treatment.  Breast Cancer  Practice breast self-awareness. This means understanding how your breasts normally appear and feel.  It also means doing regular breast self-exams. Let your health care provider know about any changes, no matter how small.  If you are in your 20s or 30s, you should have a clinical breast exam (CBE) by a health care provider every 1-3 years as part of a regular health exam.  If you are 8 or older, have a CBE every year. Also consider having a breast X-ray (mammogram) every year.  If you have a  family history of breast cancer, talk to your health care provider about genetic screening.  If you are at high risk for breast cancer, talk to your health care provider about having an MRI and a mammogram every year.  Breast cancer gene (BRCA) assessment is recommended for women who have family members with BRCA-related cancers. BRCA-related cancers include: ? Breast. ? Ovarian. ? Tubal. ? Peritoneal cancers.  Results of the assessment will determine the need for genetic counseling and BRCA1 and BRCA2 testing.  Cervical  Cancer Your health care provider may recommend that you be screened regularly for cancer of the pelvic organs (ovaries, uterus, and vagina). This screening involves a pelvic examination, including checking for microscopic changes to the surface of your cervix (Pap test). You may be encouraged to have this screening done every 3 years, beginning at age 21.  For women ages 30-65, health care providers may recommend pelvic exams and Pap testing every 3 years, or they may recommend the Pap and pelvic exam, combined with testing for human papilloma virus (HPV), every 5 years. Some types of HPV increase your risk of cervical cancer. Testing for HPV may also be done on women of any age with unclear Pap test results.  Other health care providers may not recommend any screening for nonpregnant women who are considered low risk for pelvic cancer and who do not have symptoms. Ask your health care provider if a screening pelvic exam is right for you.  If you have had past treatment for cervical cancer or a condition that could lead to cancer, you need Pap tests and screening for cancer for at least 20 years after your treatment. If Pap tests have been discontinued, your risk factors (such as having a new sexual partner) need to be reassessed to determine if screening should resume. Some women have medical problems that increase the chance of getting cervical cancer. In these cases, your health care provider may recommend more frequent screening and Pap tests.  Colorectal Cancer  This type of cancer can be detected and often prevented.  Routine colorectal cancer screening usually begins at 39 years of age and continues through 39 years of age.  Your health care provider may recommend screening at an earlier age if you have risk factors for colon cancer.  Your health care provider may also recommend using home test kits to check for hidden blood in the stool.  A small camera at the end of a tube can be used to  examine your colon directly (sigmoidoscopy or colonoscopy). This is done to check for the earliest forms of colorectal cancer.  Routine screening usually begins at age 50.  Direct examination of the colon should be repeated every 5-10 years through 39 years of age. However, you may need to be screened more often if early forms of precancerous polyps or small growths are found.  Skin Cancer  Check your skin from head to toe regularly.  Tell your health care provider about any new moles or changes in moles, especially if there is a change in a mole's shape or color.  Also tell your health care provider if you have a mole that is larger than the size of a pencil eraser.  Always use sunscreen. Apply sunscreen liberally and repeatedly throughout the day.  Protect yourself by wearing long sleeves, pants, a wide-brimmed hat, and sunglasses whenever you are outside.  Heart disease, diabetes, and high blood pressure  High blood pressure causes heart disease and increases the risk of   stroke. High blood pressure is more likely to develop in: ? People who have blood pressure in the high end of the normal range (130-139/85-89 mm Hg). ? People who are overweight or obese. ? People who are African American.  If you are 18-39 years of age, have your blood pressure checked every 3-5 years. If you are 40 years of age or older, have your blood pressure checked every year. You should have your blood pressure measured twice-once when you are at a hospital or clinic, and once when you are not at a hospital or clinic. Record the average of the two measurements. To check your blood pressure when you are not at a hospital or clinic, you can use: ? An automated blood pressure machine at a pharmacy. ? A home blood pressure monitor.  If you are between 55 years and 79 years old, ask your health care provider if you should take aspirin to prevent strokes.  Have regular diabetes screenings. This involves taking a  blood sample to check your fasting blood sugar level. ? If you are at a normal weight and have a low risk for diabetes, have this test once every three years after 39 years of age. ? If you are overweight and have a high risk for diabetes, consider being tested at a younger age or more often. Preventing infection Hepatitis B  If you have a higher risk for hepatitis B, you should be screened for this virus. You are considered at high risk for hepatitis B if: ? You were born in a country where hepatitis B is common. Ask your health care provider which countries are considered high risk. ? Your parents were born in a high-risk country, and you have not been immunized against hepatitis B (hepatitis B vaccine). ? You have HIV or AIDS. ? You use needles to inject street drugs. ? You live with someone who has hepatitis B. ? You have had sex with someone who has hepatitis B. ? You get hemodialysis treatment. ? You take certain medicines for conditions, including cancer, organ transplantation, and autoimmune conditions.  Hepatitis C  Blood testing is recommended for: ? Everyone born from 1945 through 1965. ? Anyone with known risk factors for hepatitis C.  Sexually transmitted infections (STIs)  You should be screened for sexually transmitted infections (STIs) including gonorrhea and chlamydia if: ? You are sexually active and are younger than 39 years of age. ? You are older than 39 years of age and your health care provider tells you that you are at risk for this type of infection. ? Your sexual activity has changed since you were last screened and you are at an increased risk for chlamydia or gonorrhea. Ask your health care provider if you are at risk.  If you do not have HIV, but are at risk, it may be recommended that you take a prescription medicine daily to prevent HIV infection. This is called pre-exposure prophylaxis (PrEP). You are considered at risk if: ? You are sexually active and  do not regularly use condoms or know the HIV status of your partner(s). ? You take drugs by injection. ? You are sexually active with a partner who has HIV.  Talk with your health care provider about whether you are at high risk of being infected with HIV. If you choose to begin PrEP, you should first be tested for HIV. You should then be tested every 3 months for as long as you are taking PrEP. Pregnancy  If   you are premenopausal and you may become pregnant, ask your health care provider about preconception counseling.  If you may become pregnant, take 400 to 800 micrograms (mcg) of folic acid every day.  If you want to prevent pregnancy, talk to your health care provider about birth control (contraception). Osteoporosis and menopause  Osteoporosis is a disease in which the bones lose minerals and strength with aging. This can result in serious bone fractures. Your risk for osteoporosis can be identified using a bone density scan.  If you are 24 years of age or older, or if you are at risk for osteoporosis and fractures, ask your health care provider if you should be screened.  Ask your health care provider whether you should take a calcium or vitamin D supplement to lower your risk for osteoporosis.  Menopause may have certain physical symptoms and risks.  Hormone replacement therapy may reduce some of these symptoms and risks. Talk to your health care provider about whether hormone replacement therapy is right for you. Follow these instructions at home:  Schedule regular health, dental, and eye exams.  Stay current with your immunizations.  Do not use any tobacco products including cigarettes, chewing tobacco, or electronic cigarettes.  If you are pregnant, do not drink alcohol.  If you are breastfeeding, limit how much and how often you drink alcohol.  Limit alcohol intake to no more than 1 drink per day for nonpregnant women. One drink equals 12 ounces of beer, 5 ounces of  wine, or 1 ounces of hard liquor.  Do not use street drugs.  Do not share needles.  Ask your health care provider for help if you need support or information about quitting drugs.  Tell your health care provider if you often feel depressed.  Tell your health care provider if you have ever been abused or do not feel safe at home. This information is not intended to replace advice given to you by your health care provider. Make sure you discuss any questions you have with your health care provider. Document Released: 09/01/2010 Document Revised: 07/25/2015 Document Reviewed: 11/20/2014 Elsevier Interactive Patient Education  2018 [employer].   Palpitations A palpitation is the feeling that your heart:  Has an uneven (irregular) heartbeat.  Is beating faster than normal.  Is fluttering.  Is skipping a beat.  This is usually not a serious problem. In some cases, you may need more medical tests. Follow these instructions at home:  Avoid: ? Caffeine in coffee, tea, soft drinks, diet pills, and energy drinks. ? Chocolate. ? Alcohol.  Do not use any tobacco products. These include cigarettes, chewing tobacco, and e-cigarettes. If you need help quitting, ask your doctor.  Try to reduce your stress. These things may help: ? Yoga. ? Meditation. ? Physical activity. Swimming, jogging, and walking are good choices. ? A method that helps you use your mind to control things in your body, like heartbeats (biofeedback).  Get plenty of rest and sleep.  Take over-the-counter and prescription medicines only as told by your doctor.  Keep all follow-up visits as told by your doctor. This is important. Contact a doctor if:  Your heartbeat is still fast or uneven after 24 hours.  Your palpitations occur more often. Get help right away if:  You have chest pain.  You feel short of breath.  You have a very bad headache.  You feel dizzy.  You pass out (faint). This information  is not intended to replace advice given to you  by your health care provider. Make sure you discuss any questions you have with your health care provider. Document Released: 11/26/2007 Document Revised: 07/25/2015 Document Reviewed: 11/01/2014 Elsevier Interactive Patient Education  [employer].

## 2017-12-04 LAB — HIV ANTIBODY (ROUTINE TESTING W REFLEX): HIV: NONREACTIVE

## 2017-12-04 LAB — LIPID PANEL
CHOLESTEROL: 157 mg/dL (ref <200)
HDL: 76 mg/dL (ref >50)
LDL Cholesterol (Calc): 66 mg/dL (calc)
Non-HDL Cholesterol (Calc): 81 mg/dL (calc) (ref <130)
Total CHOL/HDL Ratio: 2.1 (calc) (ref <5.0)
Triglycerides: 74 mg/dL (ref <150)

## 2017-12-06 ENCOUNTER — Encounter: Payer: Self-pay | Admitting: Nurse Practitioner

## 2017-12-10 ENCOUNTER — Ambulatory Visit: Payer: BC Managed Care – PPO | Admitting: Family Medicine

## 2017-12-16 ENCOUNTER — Emergency Department (HOSPITAL_BASED_OUTPATIENT_CLINIC_OR_DEPARTMENT_OTHER)
Admission: EM | Admit: 2017-12-16 | Discharge: 2017-12-17 | Disposition: A | Discharge status: Home / Self Care | Payer: BC Managed Care – PPO | Attending: Emergency Medicine | Admitting: Emergency Medicine

## 2017-12-16 ENCOUNTER — Other Ambulatory Visit: Payer: Self-pay

## 2017-12-16 ENCOUNTER — Encounter (HOSPITAL_BASED_OUTPATIENT_CLINIC_OR_DEPARTMENT_OTHER): Payer: Self-pay | Admitting: *Deleted

## 2017-12-16 ENCOUNTER — Emergency Department (HOSPITAL_BASED_OUTPATIENT_CLINIC_OR_DEPARTMENT_OTHER): Payer: BC Managed Care – PPO

## 2017-12-16 DIAGNOSIS — R079 Chest pain, unspecified: Secondary | ICD-10-CM | POA: Diagnosis present

## 2017-12-16 DIAGNOSIS — R0789 Other chest pain: Secondary | ICD-10-CM

## 2017-12-16 DIAGNOSIS — Z79899 Other long term (current) drug therapy: Secondary | ICD-10-CM | POA: Diagnosis not present

## 2017-12-16 DIAGNOSIS — Z87891 Personal history of nicotine dependence: Secondary | ICD-10-CM | POA: Diagnosis not present

## 2017-12-16 LAB — CBC
HCT: 38.7 % (ref 36.0–46.0)
HEMOGLOBIN: 12.5 g/dL (ref 12.0–15.0)
MCH: 32 pg (ref 26.0–34.0)
MCHC: 32.3 g/dL (ref 30.0–36.0)
MCV: 99 fL (ref 80.0–100.0)
PLATELETS: 202 10*3/uL (ref 150–400)
RBC: 3.91 MIL/uL (ref 3.87–5.11)
RDW: 13.6 % (ref 11.5–15.5)
WBC: 5.1 10*3/uL (ref 4.0–10.5)
nRBC: 0 % (ref 0.0–0.2)

## 2017-12-16 LAB — BASIC METABOLIC PANEL
ANION GAP: 8 (ref 5–15)
BUN: 14 mg/dL (ref 6–20)
CALCIUM: 8.9 mg/dL (ref 8.9–10.3)
CO2: 21 mmol/L — AB (ref 22–32)
Chloride: 108 mmol/L (ref 98–111)
Creatinine, Ser: 0.98 mg/dL (ref 0.44–1.00)
GFR calc Af Amer: >60 mL/min (ref >60)
GFR calc non Af Amer: >60 mL/min (ref >60)
GLUCOSE: 71 mg/dL (ref 70–99)
Potassium: 3.8 mmol/L (ref 3.5–5.1)
Sodium: 137 mmol/L (ref 135–145)

## 2017-12-16 LAB — TROPONIN I: Troponin I: <0.03 ng/mL (ref <0.03)

## 2017-12-16 LAB — PREGNANCY, URINE: Preg Test, Ur: NEGATIVE

## 2017-12-16 NOTE — ED Triage Notes (Signed)
Chest pain for a month. She was seen at [facility] and had a normal EKG. She was advised to come to the ED for further testing.

## 2017-12-16 NOTE — ED Provider Notes (Signed)
[facility] Provider Note: Georgena Spurling, MD, FACEP  CSN: [id number] MRN: [id number] ARRIVAL: 12/16/17 at 2002 ROOM: [place]  Chest Pain   HISTORY OF PRESENT ILLNESS  12/16/17 11:20 PM Yvonne Johnson is a 37 y.o. female who is had episodic chest pain for about the last month.  The chest pain is located in her lower substernal and left submammary area.  There is sharp and a dull component to it.  It is never severe.  It lasts typically a minute or 2.  Nothing seems to bring it on but she can sometimes make it abate by relaxing.  It does not occur daily and may only occur a few times a day.  It is not associated with shortness of breath, diaphoresis, nausea or vomiting.  She normally takes Demadex but skipped her dose today and has some slight lower extremity swelling.   Past Medical History:  Diagnosis Date  . Pseudotumor cerebri     Past Surgical History:  Procedure Laterality Date  . CESAREAN SECTION     x 3    Family History  Problem Relation Age of Onset  . Hypertension Maternal Aunt   . Ovarian cancer Maternal Aunt 77  . Breast cancer Maternal Aunt 60  . Hypothyroidism Maternal Aunt 45       thyroid nodules  . Hypertension Paternal Aunt   . Breast cancer Paternal Aunt 59  . Hypertension Mother   . Hypothyroidism Mother 71       thyroid nodules  . Arthritis Mother   . Hypertension Father   . Colon polyps Father   . Diabetes Father   . Hyperlipidemia Father   . Hypertension Sister   . Hypothyroidism Sister 103       thyroid nodules  . Hypertension Maternal Grandmother   . Miscarriages / Stillbirths Maternal Grandmother   . Hypertension Maternal Grandfather   . Liver disease Maternal Grandfather        liver cancer secondary to ETOH  . Alcohol abuse Maternal Grandfather   . Diabetes Paternal Grandmother   . Hypertension Paternal Grandmother   . Colon cancer Paternal Grandmother 8  . Heart attack Paternal Grandfather     . Hypertension Paternal Grandfather   . Heart disease Paternal Grandfather 67       MI/CAD  . Kidney disease Paternal Aunt        secondary to sarcoidosis    Social History   Tobacco Use  . Smoking status: Former Smoker    Types: Cigarettes    Last attempt to quit: 03/05/2003    Years since quitting: 14.7  . Smokeless tobacco: Never Used  Substance Use Topics  . Alcohol use: Yes    Alcohol/week: 1.0 standard drinks    Types: 1 Glasses of wine per week    Comment: soacial  . Drug use: No    Prior to Admission medications   Medication Sig Start Date End Date Taking? Authorizing Provider  acetaZOLAMIDE (DIAMOX) 500 MG capsule Take 1 capsule (500 mg total) by mouth 2 (two) times daily. 05/14/16   Star Age, MD  acyclovir (ZOVIRAX) 400 MG tablet Take 1 tablet (400 mg total) by mouth 4 (four) times daily. For 7 days. 12/03/17   Nche, Charlene Brooke, NP  fluticasone Asencion Islam) 50 MCG/ACT nasal spray  04/17/16   [provider]  ibuprofen (ADVIL,MOTRIN) 200 MG tablet Take 200 mg by mouth daily as needed.    [provider]  Allergies Penicillins   REVIEW OF SYSTEMS  Negative except as noted here or in the History of Present Illness.   PHYSICAL EXAMINATION  Initial Vital Signs Blood pressure 100/77, pulse 72, temperature 98.8 F (37.1 C), resp. rate 18, height 5\' 8"  (1.727 m), weight 43.7 kg, last menstrual period 11/25/2017, SpO2 100 %.  Examination General: Well-developed, well-nourished female in no acute distress; appearance consistent with age of record HENT: normocephalic; atraumatic Eyes: pupils equal, round and reactive to light; extraocular muscles intact Neck: supple Heart: regular rate and rhythm; no murmur Lungs: clear to auscultation bilaterally Chest: Nontender Abdomen: soft; nondistended; nontender; no masses or hepatosplenomegaly; bowel sounds present Extremities: No deformity; full range of motion; pulses normal; trace edema of lower  legs Neurologic: Awake, alert and oriented; motor function intact in all extremities and symmetric; no facial droop Skin: Warm and dry Psychiatric: Normal mood and affect   RESULTS  Summary of this visit's results, reviewed by myself:   EKG Interpretation  Date/Time:  Thursday December 16 2017 20:13:09 EDT Ventricular Rate:  82 PR Interval:  174 QRS Duration: 80 QT Interval:  348 QTC Calculation: 406 R Axis:   76 Text Interpretation:  Normal sinus rhythm Normal ECG No significant change was found Confirmed by Ronaldo Crilly [phone]) on 12/16/2017 11:20:23 PM      Laboratory Studies: Results for orders placed or performed during the hospital encounter of 12/16/17 (from the past 24 hour(s))  Basic metabolic panel     Status: Abnormal   Collection Time: 12/16/17  8:16 PM  Result Value Ref Range   Sodium 137 135 - 145 mmol/L   Potassium 3.8 3.5 - 5.1 mmol/L   Chloride 108 98 - 111 mmol/L   CO2 21 (L) 22 - 32 mmol/L   Glucose, Bld 71 70 - 99 mg/dL   BUN 14 6 - 20 mg/dL   Creatinine, Ser 0.98 0.44 - 1.00 mg/dL   Calcium 8.9 8.9 - 10.3 mg/dL   GFR calc non Af Amer >60 >60 mL/min   GFR calc Af Amer >60 >60 mL/min   Anion gap 8 5 - 15  CBC     Status: None   Collection Time: 12/16/17  8:16 PM  Result Value Ref Range   WBC 5.1 4.0 - 10.5 K/uL   RBC 3.91 3.87 - 5.11 MIL/uL   Hemoglobin 12.5 12.0 - 15.0 g/dL   HCT 38.7 36.0 - 46.0 %   MCV 99.0 80.0 - 100.0 fL   MCH 32.0 26.0 - 34.0 pg   MCHC 32.3 30.0 - 36.0 g/dL   RDW 13.6 11.5 - 15.5 %   Platelets 202 150 - 400 K/uL   nRBC 0.0 0.0 - 0.2 %  Troponin I     Status: None   Collection Time: 12/16/17  8:16 PM  Result Value Ref Range   Troponin I <0.03 <0.03 ng/mL  Pregnancy, urine     Status: None   Collection Time: 12/16/17  8:17 PM  Result Value Ref Range   Preg Test, Ur NEGATIVE NEGATIVE   Imaging Studies: Dg Chest 2 View  Result Date: 12/16/2017 CLINICAL DATA:  Chest pain EXAM: CHEST - 2 VIEW COMPARISON:  None.  FINDINGS: The heart size and mediastinal contours are within normal limits. Both lungs are clear. The visualized skeletal structures are unremarkable. IMPRESSION: No active cardiopulmonary disease. Electronically Signed   By: Donavan Foil M.D.   On: 12/16/2017 20:25    ED COURSE and MDM  Nursing notes and  initial vitals signs, including pulse oximetry, reviewed.  Vitals:   12/16/17 2009 12/16/17 2029 12/16/17 2250 12/16/17 2300  BP: 117/72  116/81 100/77  Pulse: 83 83 70 72  Resp: 20 20 16 18   Temp: 98.8 F (37.1 C) 98.8 F (37.1 C)    TempSrc: Oral     SpO2: 100% 100% 100% 100%  Weight:      Height:       Patient is at low risk for coronary artery disease.  Her history is atypical for cardiac etiology.  We will have her follow-up with her primary care physician at [facility] who may wish her to be evaluated by cardiology.  PROCEDURES    ED DIAGNOSES     ICD-10-CM   1. Atypical chest pain R07.89        Shanon Rosser, MD 12/16/17 [phone]

## 2018-05-18 DIAGNOSIS — H5213 Myopia, bilateral: Secondary | ICD-10-CM | POA: Diagnosis not present

## 2018-08-02 ENCOUNTER — Other Ambulatory Visit: Payer: Self-pay | Admitting: Nurse Practitioner

## 2018-08-02 DIAGNOSIS — Z1231 Encounter for screening mammogram for malignant neoplasm of breast: Secondary | ICD-10-CM

## 2018-08-05 LAB — HM MAMMOGRAPHY

## 2018-08-10 ENCOUNTER — Encounter: Payer: Self-pay | Admitting: Nurse Practitioner

## 2018-08-25 ENCOUNTER — Other Ambulatory Visit: Payer: Self-pay | Admitting: Nurse Practitioner

## 2018-08-25 ENCOUNTER — Encounter: Payer: Self-pay | Admitting: Nurse Practitioner

## 2018-08-25 DIAGNOSIS — B009 Herpesviral infection, unspecified: Secondary | ICD-10-CM

## 2018-08-29 MED ORDER — ACYCLOVIR 400 MG PO TABS: 400.0000 mg | ORAL_TABLET | Freq: Four times a day (QID) | ORAL | 1 refills | Status: DC

## 2018-09-16 ENCOUNTER — Ambulatory Visit: Payer: BC Managed Care – PPO

## 2018-11-30 ENCOUNTER — Other Ambulatory Visit: Payer: Self-pay

## 2018-11-30 ENCOUNTER — Encounter: Payer: Self-pay | Admitting: Nurse Practitioner

## 2018-11-30 ENCOUNTER — Ambulatory Visit: Payer: BC Managed Care – PPO | Admitting: Nurse Practitioner

## 2018-11-30 VITALS — BP 108/80 | HR 78 | Temp 97.2°F | Ht 68.0 in | Wt 207.2 lb

## 2018-11-30 DIAGNOSIS — R300 Dysuria: Secondary | ICD-10-CM

## 2018-11-30 DIAGNOSIS — R8290 Unspecified abnormal findings in urine: Secondary | ICD-10-CM | POA: Diagnosis not present

## 2018-11-30 LAB — POCT URINALYSIS DIPSTICK
Glucose, UA: NEGATIVE
Ketones, UA: NEGATIVE
Leukocytes, UA: NEGATIVE
Nitrite, UA: NEGATIVE
Protein, UA: POSITIVE — AB
Spec Grav, UA: 1.02 (ref 1.010–1.025)
Urobilinogen, UA: 1 E.U./dL
pH, UA: 6 (ref 5.0–8.0)

## 2018-11-30 LAB — POCT URINE PREGNANCY: Preg Test, Ur: NEGATIVE

## 2018-11-30 MED ORDER — NITROFURANTOIN MONOHYD MACRO 100 MG PO CAPS: 100.0000 mg | ORAL_CAPSULE | Freq: Two times a day (BID) | ORAL | 0 refills | Status: AC

## 2018-11-30 NOTE — Patient Instructions (Signed)
Urine pregnancy is negative. Start oral abx. Maintain adequate oral hydration. Your urine was sent for culture.

## 2018-11-30 NOTE — Progress Notes (Signed)
Subjective:  Patient ID: Yvonne Johnson, female    DOB: [birth year]  Age: 38 y.o. MRN: [id number]  CC: Urinary Tract Infection (pt is c/o of burning sensation,frequent urinate and pressure lower pelvis/1 week ago/ibuprofen/)  Urinary Tract Infection  This is a new problem. The current episode started in the past 7 days. The problem occurs every urination. The problem has been unchanged. The quality of the pain is described as burning. There has been no fever. She is sexually active. There is no history of pyelonephritis. Associated symptoms include frequency and urgency. Pertinent negatives include no chills, discharge, flank pain, hematuria, hesitancy, nausea, sweats or vomiting. She has tried nothing for the symptoms. There is no history of catheterization, kidney stones, recurrent UTIs, urinary stasis or a urological procedure.   Reviewed past Medical, Social and Family history today.  Outpatient Medications Prior to Visit  Medication Sig Dispense Refill  . acetaZOLAMIDE (DIAMOX) 500 MG capsule Take 1 capsule (500 mg total) by mouth 2 (two) times daily. 180 capsule 3  . acyclovir (ZOVIRAX) 400 MG tablet Take 1 tablet (400 mg total) by mouth 4 (four) times daily. For 7 days. 28 tablet 1  . fluticasone (FLONASE) 50 MCG/ACT nasal spray     . ibuprofen (ADVIL,MOTRIN) 200 MG tablet Take 200 mg by mouth daily as needed.     Facility-Administered Medications Prior to Visit  Medication Dose Route Frequency Provider Last Rate Last Dose  . gadopentetate dimeglumine (MAGNEVIST) injection 20 mL  20 mL Intravenous Once PRN Star Age, MD        ROS See HPI  Objective:  BP 108/80   Pulse 78   Temp (!) 97.2 F (36.2 C) (Tympanic)   Ht 5\' 8"  (1.727 m)   Wt 207 lb 3.2 oz (94 kg)   LMP 11/18/2018   SpO2 99%   BMI 31.50 kg/m   BP Readings from Last 3 Encounters:  11/30/18 108/80  12/16/17 100/77  12/03/17 112/76    Wt Readings from Last 3 Encounters:  11/30/18 207 lb 3.2 oz (94 kg)   12/16/17 96 lb 6 oz (43.7 kg)  12/03/17 213 lb (96.6 kg)    Physical Exam Vitals signs reviewed.  Pulmonary:     Effort: Pulmonary effort is normal.     Breath sounds: Normal breath sounds.  Abdominal:     General: There is no distension.     Palpations: Abdomen is soft.     Tenderness: There is no right CVA tenderness, left CVA tenderness or guarding.  Neurological:     Mental Status: She is alert and oriented to person, place, and time.  Psychiatric:        Mood and Affect: Mood normal.        Behavior: Behavior normal.        Thought Content: Thought content normal.    Lab Results  Component Value Date   WBC 5.1 12/16/2017   HGB 12.5 12/16/2017   HCT 38.7 12/16/2017   PLT 202 12/16/2017   GLUCOSE 71 12/16/2017   CHOL 157 12/03/2017   TRIG 74 12/03/2017   HDL 76 12/03/2017   LDLCALC 66 12/03/2017   ALT 10 09/03/2017   AST 12 09/03/2017   NA 137 12/16/2017   K 3.8 12/16/2017   CL 108 12/16/2017   CREATININE 0.98 12/16/2017   BUN 14 12/16/2017   CO2 21 (L) 12/16/2017   TSH 1.45 09/03/2017    Dg Chest 2 View  Result Date: 12/16/2017 CLINICAL DATA:  Chest pain EXAM: CHEST - 2 VIEW COMPARISON:  None. FINDINGS: The heart size and mediastinal contours are within normal limits. Both lungs are clear. The visualized skeletal structures are unremarkable. IMPRESSION: No active cardiopulmonary disease. Electronically Signed   By: Donavan Foil M.D.   On: 12/16/2017 20:25    Assessment & Plan:   Yvonne Johnson was seen today for urinary tract infection.  Diagnoses and all orders for this visit:  Dysuria -     POCT urinalysis dipstick -     Urine Culture -     POCT urine pregnancy -     nitrofurantoin, macrocrystal-monohydrate, (MACROBID) 100 MG capsule; Take 1 capsule (100 mg total) by mouth 2 (two) times daily for 3 days.  Abnormal finding on urinalysis -     POCT urinalysis dipstick -     POCT urine pregnancy   I am having Yvonne Johnson start on  nitrofurantoin (macrocrystal-monohydrate). I am also having her maintain her fluticasone, ibuprofen, acetaZOLAMIDE, and acyclovir.  Meds ordered this encounter  Medications  . nitrofurantoin, macrocrystal-monohydrate, (MACROBID) 100 MG capsule    Sig: Take 1 capsule (100 mg total) by mouth 2 (two) times daily for 3 days.    Dispense:  6 capsule    Refill:  0    Order Specific Question:   Supervising Provider    Answer:   MATTHEWS, CODY [[id number]]    Problem List Items Addressed This Visit    None    Visit Diagnoses    Dysuria    -  Primary   Relevant Medications   nitrofurantoin, macrocrystal-monohydrate, (MACROBID) 100 MG capsule   Other Relevant Orders   POCT urinalysis dipstick (Completed)   Urine Culture   POCT urine pregnancy (Completed)   Abnormal finding on urinalysis       Relevant Orders   POCT urinalysis dipstick (Completed)   POCT urine pregnancy (Completed)       Follow-up: Return in about 4 weeks (around 12/28/2018) for CPE (fasting).  Wilfred Lacy, NP

## 2018-12-02 LAB — URINE CULTURE
MICRO NUMBER:: 939272
Result:: NO GROWTH
SPECIMEN QUALITY:: ADEQUATE

## 2018-12-05 ENCOUNTER — Telehealth: Payer: Self-pay | Admitting: Nurse Practitioner

## 2018-12-05 NOTE — Telephone Encounter (Signed)

## 2018-12-06 ENCOUNTER — Encounter: Payer: Self-pay | Admitting: Nurse Practitioner

## 2018-12-06 ENCOUNTER — Other Ambulatory Visit (HOSPITAL_COMMUNITY)
Admission: RE | Admit: 2018-12-06 | Discharge: 2018-12-06 | Disposition: A | Discharge status: Home / Self Care | Payer: BC Managed Care – PPO | Source: Ambulatory Visit | Attending: Nurse Practitioner | Admitting: Nurse Practitioner

## 2018-12-06 ENCOUNTER — Ambulatory Visit (INDEPENDENT_AMBULATORY_CARE_PROVIDER_SITE_OTHER): Payer: BC Managed Care – PPO | Admitting: Nurse Practitioner

## 2018-12-06 ENCOUNTER — Other Ambulatory Visit: Payer: Self-pay

## 2018-12-06 VITALS — BP 105/70 | HR 72 | Temp 97.3°F | Ht 68.0 in | Wt 205.0 lb

## 2018-12-06 DIAGNOSIS — T781XXA Other adverse food reactions, not elsewhere classified, initial encounter: Secondary | ICD-10-CM

## 2018-12-06 DIAGNOSIS — T148XXA Other injury of unspecified body region, initial encounter: Secondary | ICD-10-CM

## 2018-12-06 DIAGNOSIS — R102 Pelvic and perineal pain: Secondary | ICD-10-CM

## 2018-12-06 DIAGNOSIS — Z803 Family history of malignant neoplasm of breast: Secondary | ICD-10-CM | POA: Diagnosis not present

## 2018-12-06 DIAGNOSIS — Z8041 Family history of malignant neoplasm of ovary: Secondary | ICD-10-CM

## 2018-12-06 DIAGNOSIS — R3915 Urgency of urination: Secondary | ICD-10-CM | POA: Diagnosis not present

## 2018-12-06 DIAGNOSIS — Z800 Family history of malignant neoplasm of digestive organs: Secondary | ICD-10-CM | POA: Diagnosis not present

## 2018-12-06 DIAGNOSIS — Z0000 Encounter for general adult medical examination without abnormal findings: Secondary | ICD-10-CM

## 2018-12-06 DIAGNOSIS — L304 Erythema intertrigo: Secondary | ICD-10-CM

## 2018-12-06 DIAGNOSIS — Z136 Encounter for screening for cardiovascular disorders: Secondary | ICD-10-CM | POA: Diagnosis not present

## 2018-12-06 DIAGNOSIS — W57XXXA Bitten or stung by nonvenomous insect and other nonvenomous arthropods, initial encounter: Secondary | ICD-10-CM

## 2018-12-06 DIAGNOSIS — Z13220 Encounter for screening for lipoid disorders: Secondary | ICD-10-CM

## 2018-12-06 LAB — COMPREHENSIVE METABOLIC PANEL
ALT: 8 U/L (ref 0–35)
AST: 12 U/L (ref 0–37)
Albumin: 4.1 g/dL (ref 3.5–5.2)
Alkaline Phosphatase: 42 U/L (ref 39–117)
BUN: 13 mg/dL (ref 6–23)
CO2: 23 mEq/L (ref 19–32)
Calcium: 9.4 mg/dL (ref 8.4–10.5)
Chloride: 107 mEq/L (ref 96–112)
Creatinine, Ser: 0.85 mg/dL (ref 0.40–1.20)
GFR: 73.92 mL/min (ref >60.00)
Glucose, Bld: 81 mg/dL (ref 70–99)
Potassium: 3.8 mEq/L (ref 3.5–5.1)
Sodium: 137 mEq/L (ref 135–145)
Total Bilirubin: 0.7 mg/dL (ref 0.2–1.2)
Total Protein: 7.7 g/dL (ref 6.0–8.3)

## 2018-12-06 LAB — LIPID PANEL
Cholesterol: 152 mg/dL (ref 0–200)
HDL: 83.1 mg/dL (ref >39.00)
LDL Cholesterol: 58 mg/dL (ref 0–99)
NonHDL: 68.88
Total CHOL/HDL Ratio: 2
Triglycerides: 54 mg/dL (ref 0.0–149.0)
VLDL: 10.8 mg/dL (ref 0.0–40.0)

## 2018-12-06 LAB — CBC
HCT: 38.9 % (ref 36.0–46.0)
Hemoglobin: 13.1 g/dL (ref 12.0–15.0)
MCHC: 33.6 g/dL (ref 30.0–36.0)
MCV: 98 fl (ref 78.0–100.0)
Platelets: 197 10*3/uL (ref 150.0–400.0)
RBC: 3.97 Mil/uL (ref 3.87–5.11)
RDW: 13.3 % (ref 11.5–15.5)
WBC: 3.7 10*3/uL — ABNORMAL LOW (ref 4.0–10.5)

## 2018-12-06 LAB — TSH: TSH: 1.47 u[IU]/mL (ref 0.35–4.50)

## 2018-12-06 MED ORDER — NYSTATIN-TRIAMCINOLONE 100000-0.1 UNIT/GM-% EX OINT: 1.0000 "application " | TOPICAL_OINTMENT | Freq: Two times a day (BID) | CUTANEOUS | 1 refills | Status: DC

## 2018-12-06 NOTE — Progress Notes (Signed)
Subjective:    Patient ID: Yvonne Johnson, female    DOB: 1980-06-13, 38 y.o.   MRN: [id number]  Patient presents today for complete physical and eval of other ocute symptoms.  She also reports persistent urinary urgency and pelvic pain. despite negative urine culture. US pelvic completed 2015 (normal)  Rash This is a recurrent problem. The current episode started more than 1 month ago. The problem has been waxing and waning since onset. The affected locations include the groin. The rash is characterized by itchiness. She was exposed to nothing. Pertinent negatives include no anorexia, congestion, cough, diarrhea, fatigue, fever, joint pain, shortness of breath, sore throat or vomiting. Past treatments include anti-itch cream. The treatment provided mild relief.  GI Problem The primary symptoms include abdominal pain, nausea and rash. Primary symptoms do not include fever, weight loss, fatigue, vomiting, diarrhea, melena, hematemesis, jaundice, hematochezia, dysuria, myalgias or arthralgias. Primary symptoms comment: with consumption of meat. The onset was sudden. The problem has been gradually improving.  The abdominal pain is generalized. The abdominal pain does not radiate. The severity of the abdominal pain is 10/10. Relieved by: bland diet or avoiding meat consumption.  The rash is not associated with itching.  The illness does not include chills, anorexia, dysphagia, bloating, constipation or itching.  onset of GI symptoms after multiple tick bites 27months ago.  She is requesting for genetic testing due to family Hx of colon, breast and ovarian cancer (paternal and maternal.  Hx of pseudotumor cerebri: appt with neurology annually. Use of diamox at this time. No headache, no change in vision, no change in gait.  Sexual History (orientation,birth control, marital status, STD):married, sexually active, no contraception. Negative urine pregnancy last week. LMP 10/25/2018   Depression/Suicide: Depression screen [facility] 2/9 11/30/2018 12/03/2017 12/25/2016  Decreased Interest 0 0 0  Down, Depressed, Hopeless 0 0 0  PHQ - 2 Score 0 0 0   Vision:up to date, done annually  Dental:up to date  Immunizations: (TDAP, Hep C screen, Pneumovax, Influenza, zoster)  Health Maintenance  Topic Date Due  . Flu Shot  10/01/2018  . Tetanus Vaccine  06/27/2019  . Pap Smear  12/26/2019  . Colon Cancer Screening  09/10/2022  . HIV Screening  Completed   Diet:avoids red meat.  Weight:  Wt Readings from Last 3 Encounters:  12/06/18 205 lb (93 kg)  11/30/18 207 lb 3.2 oz (94 kg)  12/16/17 96 lb 6 oz (43.7 kg)   Exercise:none  Fall Risk: Fall Risk  11/30/2018 12/03/2017  Falls in the past year? 0 No   Advanced Directive: Advanced Directives 12/16/2017  Does Patient Have a Medical Advance Directive? No    Medications and allergies reviewed with patient and updated if appropriate.  Patient Active Problem List   Diagnosis Date Noted  . Colon polyp 12/03/2017  . Class 1 obesity due to excess calories without serious comorbidity with body mass index (BMI) of 32.0 to 32.9 in adult 12/25/2016  . Herpes simplex 12/25/2016  . CN (constipation) 04/17/2014  . Generalized abdominal pain 04/17/2014  . Abdominal bloating 04/17/2014  . Nipple discharge 07/10/2013  . Bilateral low back pain 07/10/2013  . Abdominal pain, left lower quadrant 07/10/2013  . Weight loss, unintentional 07/10/2013  . Pseudotumor cerebri     Current Outpatient Medications on File Prior to Visit  Medication Sig Dispense Refill  . acetaZOLAMIDE (DIAMOX) 500 MG capsule Take 1 capsule (500 mg total) by mouth 2 (two) times daily. 180 capsule 3  .  acyclovir (ZOVIRAX) 400 MG tablet Take 1 tablet (400 mg total) by mouth 4 (four) times daily. For 7 days. 28 tablet 1  . fluticasone (FLONASE) 50 MCG/ACT nasal spray     . ibuprofen (ADVIL,MOTRIN) 200 MG tablet Take 200 mg by mouth daily as needed.     Current  Facility-Administered Medications on File Prior to Visit  Medication Dose Route Frequency Provider Last Rate Last Dose  . gadopentetate dimeglumine (MAGNEVIST) injection 20 mL  20 mL Intravenous Once PRN Star Age, MD        Past Medical History:  Diagnosis Date  . Pseudotumor cerebri     Past Surgical History:  Procedure Laterality Date  . CESAREAN SECTION     x 3    Social History   Socioeconomic History  . Marital status: Married    Spouse name: Not on file  . Number of children: 4  . Years of education: Not on file  . Highest education level: Not on file  Occupational History  . Occupation: [employer]  . Financial resource strain: Not on file  . Food insecurity    Worry: Not on file    Inability: Not on file  . Transportation needs    Medical: Not on file    Non-medical: Not on file  Tobacco Use  . Smoking status: Former Smoker    Types: Cigarettes    Quit date: 03/05/2003    Years since quitting: 15.7  . Smokeless tobacco: Never Used  Substance and Sexual Activity  . Alcohol use: Yes    Alcohol/week: 1.0 standard drinks    Types: 1 Glasses of wine per week    Comment: soacial  . Drug use: No  . Sexual activity: Yes    Birth control/protection: None  Lifestyle  . Physical activity    Days per week: Not on file    Minutes per session: Not on file  . Stress: Not on file  Relationships  . Social Herbalist on phone: Not on file    Gets together: Not on file    Attends religious service: Not on file    Active member of club or organization: Not on file    Attends meetings of clubs or organizations: Not on file    Relationship status: Not on file  Other Topics Concern  . Not on file  Social History Narrative   Regular Exercise -  YES          Family History  Problem Relation Age of Onset  . Hypertension Maternal Aunt   . Ovarian cancer Maternal Aunt 36  . Breast cancer Maternal Aunt 60  . Hypothyroidism Maternal  Aunt 45       thyroid nodules  . Hypertension Paternal Aunt   . Breast cancer Paternal Aunt 56  . Hypertension Mother   . Hypothyroidism Mother 67       thyroid nodules  . Arthritis Mother   . Hypertension Father   . Colon polyps Father   . Diabetes Father   . Hyperlipidemia Father   . Hypertension Sister   . Hypothyroidism Sister 42       thyroid nodules  . Hypertension Maternal Grandmother   . Miscarriages / Stillbirths Maternal Grandmother   . Hypertension Maternal Grandfather   . Liver disease Maternal Grandfather        liver cancer secondary to ETOH  . Alcohol abuse Maternal Grandfather   . Diabetes Paternal Grandmother   .  Hypertension Paternal Grandmother   . Colon cancer Paternal Grandmother 97  . Heart attack Paternal Grandfather   . Hypertension Paternal Grandfather   . Heart disease Paternal Grandfather 58       MI/CAD  . Kidney disease Paternal Aunt        secondary to sarcoidosis       Review of Systems  Constitutional: Negative for chills, fatigue, fever and weight loss.  HENT: Negative for congestion and sore throat.   Respiratory: Negative for cough and shortness of breath.   Gastrointestinal: Positive for abdominal pain and nausea. Negative for anorexia, bloating, constipation, diarrhea, dysphagia, hematemesis, hematochezia, jaundice, melena and vomiting.  Genitourinary: Negative for dysuria.  Musculoskeletal: Negative for arthralgias, joint pain and myalgias.  Skin: Positive for rash. Negative for itching.    Objective:   Vitals:   12/06/18 1006  BP: 105/70  Pulse: 72  Temp: (!) 97.3 F (36.3 C)  SpO2: 100%   Body mass index is 31.17 kg/m.  Physical Examination:  Physical Exam Vitals signs reviewed. Exam conducted with a chaperone present.  Constitutional:      General: She is not in acute distress.    Appearance: She is well-developed.  HENT:     Head: Normocephalic.     Right Ear: Tympanic membrane, ear canal and external ear  normal.     Left Ear: Tympanic membrane, ear canal and external ear normal.  Eyes:     Extraocular Movements: Extraocular movements intact.     Conjunctiva/sclera: Conjunctivae normal.     Pupils: Pupils are equal, round, and reactive to light.  Cardiovascular:     Rate and Rhythm: Normal rate and regular rhythm.     Pulses: Normal pulses.     Heart sounds: Normal heart sounds.  Pulmonary:     Effort: Pulmonary effort is normal. No respiratory distress.     Breath sounds: Normal breath sounds.  Chest:     Chest wall: No tenderness.     Comments: Declined breast exam  Abdominal:     General: Bowel sounds are normal.     Palpations: Abdomen is soft.     Tenderness: There is no abdominal tenderness. There is no right CVA tenderness or left CVA tenderness.     Hernia: There is no hernia in the left inguinal area or right inguinal area.  Genitourinary:    General: Normal vulva.     Pubic Area: Rash present.     Labia:        Right: No rash or tenderness.        Left: No rash or tenderness.      Vagina: Vaginal discharge present. No erythema.     Cervix: Discharge present. No friability or erythema.     Uterus: Normal.      Adnexa: Right adnexa normal and left adnexa normal.     Rectum: Normal.    Musculoskeletal: Normal range of motion.     Right lower leg: No edema.     Left lower leg: No edema.  Skin:    General: Skin is warm and dry.     Findings: No rash.  Neurological:     Mental Status: She is alert and oriented to person, place, and time.     Deep Tendon Reflexes: Reflexes are normal and symmetric.  Psychiatric:        Mood and Affect: Mood normal.        Behavior: Behavior normal.        Thought  Content: Thought content normal.     ASSESSMENT and PLAN:  Roya was seen today for annual exam.  Diagnoses and all orders for this visit:  Preventative health care -     CBC -     Comprehensive metabolic panel -     Lipid panel -     TSH  Encounter for  lipid screening for cardiovascular disease -     TSH  Urinary urgency -     Urinalysis w microscopic + reflex cultur -     US Pelvic Complete With Transvaginal; Future  Pelvic pain -     Cervicovaginal ancillary only( [place]) -     US Pelvic Complete With Transvaginal; Future  Tick bite, initial encounter -     LymeAb(IgG/M)+RMSF(IgG/M) -     Alpha Gal IgE  Gastrointestinal food sensitivity -     LymeAb(IgG/M)+RMSF(IgG/M) -     Alpha Gal IgE  Family history of colon cancer -     Ambulatory referral to Genetics  Family history of breast cancer -     Ambulatory referral to Genetics  Family history of ovarian cancer -     Ambulatory referral to Genetics  Pruritic intertrigo -     nystatin-triamcinolone ointment (MYCOLOG); Apply 1 application topically 2 (two) times daily.    No problem-specific Assessment & Plan notes found for this encounter.      Problem List Items Addressed This Visit    None    Visit Diagnoses    Preventative health care    -  Primary   Relevant Orders   CBC   Comprehensive metabolic panel   Lipid panel   TSH   Encounter for lipid screening for cardiovascular disease       Relevant Orders   TSH   Urinary urgency       Relevant Orders   Urinalysis w microscopic + reflex cultur   US Pelvic Complete With Transvaginal   Pelvic pain       Relevant Orders   Cervicovaginal ancillary only( [place])   US Pelvic Complete With Transvaginal   Tick bite, initial encounter       Relevant Orders   LymeAb(IgG/M)+RMSF(IgG/M)   Alpha Gal IgE   Gastrointestinal food sensitivity       Relevant Orders   LymeAb(IgG/M)+RMSF(IgG/M)   Alpha Gal IgE   Family history of colon cancer       Relevant Orders   Ambulatory referral to Genetics   Family history of breast cancer       Relevant Orders   Ambulatory referral to Genetics   Family history of ovarian cancer       Relevant Orders   Ambulatory referral to Genetics   Pruritic intertrigo        Relevant Medications   nystatin-triamcinolone ointment (MYCOLOG)      Follow up: Return if symptoms worsen or fail to improve.  Wilfred Lacy, NP

## 2018-12-06 NOTE — Patient Instructions (Signed)
Go to lab for blood draw.  You will be contacted to schedule appt for pelvic US.   Health Maintenance, Female Adopting a healthy lifestyle and getting preventive care are important in promoting health and wellness. Ask your health care provider about:  The right schedule for you to have regular tests and exams.  Things you can do on your own to prevent diseases and keep yourself healthy. What should I know about diet, weight, and exercise? Eat a healthy diet   Eat a diet that includes plenty of vegetables, fruits, low-fat dairy products, and lean protein.  Do not eat a lot of foods that are high in solid fats, added sugars, or sodium. Maintain a healthy weight Body mass index (BMI) is used to identify weight problems. It estimates body fat based on height and weight. Your health care provider can help determine your BMI and help you achieve or maintain a healthy weight. Get regular exercise Get regular exercise. This is one of the most important things you can do for your health. Most adults should:  Exercise for at least 150 minutes each week. The exercise should increase your heart rate and make you sweat (moderate-intensity exercise).  Do strengthening exercises at least twice a week. This is in addition to the moderate-intensity exercise.  Spend less time sitting. Even light physical activity can be beneficial. Watch cholesterol and blood lipids Have your blood tested for lipids and cholesterol at 40 years of age, then have this test every 5 years. Have your cholesterol levels checked more often if:  Your lipid or cholesterol levels are high.  You are older than 40 years of age.  You are at high risk for heart disease. What should I know about cancer screening? Depending on your health history and family history, you may need to have cancer screening at various ages. This may include screening for:  Breast cancer.  Cervical cancer.  Colorectal cancer.  Skin cancer.   Lung cancer. What should I know about heart disease, diabetes, and high blood pressure? Blood pressure and heart disease  High blood pressure causes heart disease and increases the risk of stroke. This is more likely to develop in people who have high blood pressure readings, are of African descent, or are overweight.  Have your blood pressure checked: ? Every 3-5 years if you are 79-34 years of age. ? Every year if you are 19 years old or older. Diabetes Have regular diabetes screenings. This checks your fasting blood sugar level. Have the screening done:  Once every three years after age 82 if you are at a normal weight and have a low risk for diabetes.  More often and at a younger age if you are overweight or have a high risk for diabetes. What should I know about preventing infection? Hepatitis B If you have a higher risk for hepatitis B, you should be screened for this virus. Talk with your health care provider to find out if you are at risk for hepatitis B infection. Hepatitis C Testing is recommended for:  Everyone born from [birth year] through 1967.  Anyone with known risk factors for hepatitis C. Sexually transmitted infections (STIs)  Get screened for STIs, including gonorrhea and chlamydia, if: ? You are sexually active and are younger than 40 years of age. ? You are older than 40 years of age and your health care provider tells you that you are at risk for this type of infection. ? Your sexual activity has changed since you  were last screened, and you are at increased risk for chlamydia or gonorrhea. Ask your health care provider if you are at risk.  Ask your health care provider about whether you are at high risk for HIV. Your health care provider may recommend a prescription medicine to help prevent HIV infection. If you choose to take medicine to prevent HIV, you should first get tested for HIV. You should then be tested every 3 months for as long as you are taking the  medicine. Pregnancy  If you are about to stop having your period (premenopausal) and you may become pregnant, seek counseling before you get pregnant.  Take 400 to 800 micrograms (mcg) of folic acid every day if you become pregnant.  Ask for birth control (contraception) if you want to prevent pregnancy. Osteoporosis and menopause Osteoporosis is a disease in which the bones lose minerals and strength with aging. This can result in bone fractures. If you are 32 years old or older, or if you are at risk for osteoporosis and fractures, ask your health care provider if you should:  Be screened for bone loss.  Take a calcium or vitamin D supplement to lower your risk of fractures.  Be given hormone replacement therapy (HRT) to treat symptoms of menopause. Follow these instructions at home: Lifestyle  Do not use any products that contain nicotine or tobacco, such as cigarettes, e-cigarettes, and chewing tobacco. If you need help quitting, ask your health care provider.  Do not use street drugs.  Do not share needles.  Ask your health care provider for help if you need support or information about quitting drugs. Alcohol use  Do not drink alcohol if: ? Your health care provider tells you not to drink. ? You are pregnant, may be pregnant, or are planning to become pregnant.  If you drink alcohol: ? Limit how much you use to 0-1 drink a day. ? Limit intake if you are breastfeeding.  Be aware of how much alcohol is in your drink. In the [place] one drink equals one 12 oz bottle of beer (355 mL), one 5 oz glass of wine (148 mL), or one 1 oz glass of hard liquor (44 mL). General instructions  Schedule regular health, dental, and eye exams.  Stay current with your vaccines.  Tell your health care provider if: ? You often feel depressed. ? You have ever been abused or do not feel safe at home. Summary  Adopting a healthy lifestyle and getting preventive care are important in  promoting health and wellness.  Follow your health care provider's instructions about healthy diet, exercising, and getting tested or screened for diseases.  Follow your health care provider's instructions on monitoring your cholesterol and blood pressure. This information is not intended to replace advice given to you by your health care provider. Make sure you discuss any questions you have with your health care provider. Document Released: 09/01/2010 Document Revised: 02/09/2018 Document Reviewed: 02/09/2018 Elsevier Patient Education  2020 [employer].

## 2018-12-07 ENCOUNTER — Telehealth: Payer: Self-pay | Admitting: Genetic Counselor

## 2018-12-07 LAB — URINALYSIS W MICROSCOPIC + REFLEX CULTURE
Bacteria, UA: NONE SEEN /HPF
Bilirubin Urine: NEGATIVE
Glucose, UA: NEGATIVE
Hgb urine dipstick: NEGATIVE
Hyaline Cast: NONE SEEN /LPF
Ketones, ur: NEGATIVE
Leukocyte Esterase: NEGATIVE
Nitrites, Initial: NEGATIVE
Specific Gravity, Urine: 1.043 — ABNORMAL HIGH (ref 1.001–1.03)
Squamous Epithelial / LPF: NONE SEEN /HPF (ref <=5)
pH: 5.5 (ref 5.0–8.0)

## 2018-12-07 LAB — NO CULTURE INDICATED

## 2018-12-07 NOTE — Telephone Encounter (Signed)
A genetic counseling appt has been scheduled fro Yvonne Johnson to see Roma Kayser on 10/14 at 9am. Pt has been made aware to arrive 15 minutes early.

## 2018-12-10 LAB — ALPHA GAL IGE: Alpha Gal IgE*: 26.8 kU/L — ABNORMAL HIGH (ref <0.10)

## 2018-12-13 ENCOUNTER — Telehealth: Payer: Self-pay | Admitting: Genetic Counselor

## 2018-12-13 LAB — LYME, WESTERN BLOT, SERUM (REFLEXED)
IgG P18 Ab.: ABSENT
IgG P23 Ab.: ABSENT
IgG P28 Ab.: ABSENT
IgG P30 Ab.: ABSENT
IgG P39 Ab.: ABSENT
IgG P41 Ab.: ABSENT
IgG P45 Ab.: ABSENT
IgG P58 Ab.: ABSENT
IgG P66 Ab.: ABSENT
IgG P93 Ab.: ABSENT
IgM P23 Ab.: ABSENT
IgM P39 Ab.: ABSENT
IgM P41 Ab.: ABSENT
Lyme IgG Wb: NEGATIVE
Lyme IgM Wb: NEGATIVE

## 2018-12-13 LAB — LYMEAB(IGG/M)+RMSF(IGG/M)
LYME DISEASE AB, QUANT, IGM: 1.82 index — ABNORMAL HIGH (ref 0.00–0.79)
Lyme IgG/IgM Ab: <0.91 {ISR} (ref 0.00–0.90)
RMSF IgG: NEGATIVE
RMSF IgM: 0.67 index (ref 0.00–0.89)

## 2018-12-13 NOTE — Telephone Encounter (Signed)
Called patient regarding upcoming Webex appointment, screener complete and e-mail has been sent.  °

## 2018-12-14 ENCOUNTER — Inpatient Hospital Stay: Payer: BC Managed Care – PPO | Attending: Genetic Counselor | Admitting: Genetic Counselor

## 2018-12-14 ENCOUNTER — Other Ambulatory Visit: Payer: Self-pay | Admitting: Genetic Counselor

## 2018-12-14 ENCOUNTER — Encounter: Payer: Self-pay | Admitting: Genetic Counselor

## 2018-12-14 ENCOUNTER — Inpatient Hospital Stay: Payer: BC Managed Care – PPO

## 2018-12-14 DIAGNOSIS — K635 Polyp of colon: Secondary | ICD-10-CM

## 2018-12-14 DIAGNOSIS — Z800 Family history of malignant neoplasm of digestive organs: Secondary | ICD-10-CM | POA: Diagnosis not present

## 2018-12-14 DIAGNOSIS — Z803 Family history of malignant neoplasm of breast: Secondary | ICD-10-CM | POA: Insufficient documentation

## 2018-12-14 DIAGNOSIS — G932 Benign intracranial hypertension: Secondary | ICD-10-CM

## 2018-12-14 DIAGNOSIS — Z8041 Family history of malignant neoplasm of ovary: Secondary | ICD-10-CM

## 2018-12-14 DIAGNOSIS — Z315 Encounter for genetic counseling: Secondary | ICD-10-CM

## 2018-12-14 DIAGNOSIS — Z8371 Family history of colonic polyps: Secondary | ICD-10-CM

## 2018-12-14 NOTE — Progress Notes (Signed)
REFERRING PROVIDER: Flossie Buffy, NP [place],  [place] [id number]  PRIMARY PROVIDER:  Flossie Buffy, NP  PRIMARY REASON FOR VISIT:  1. Polyp of colon, unspecified part of colon, unspecified type   2. Family history of breast cancer   3. Family history of ovarian cancer   4. Family history of colon cancer   5. Family history of colonic polyps      HISTORY OF PRESENT ILLNESS:   I connected with Ms. Ralph on 12/14/2018 at 9 AM EDT by Webex video conference and verified that I am speaking with the correct person using two identifiers.   Patient location: Work Provider location: Office  Ms. Villari, a 38 y.o. female, was seen for a [facility] cancer genetics consultation at the request of Dr. Lorayne Marek due to a family history of cancer.  Ms. Adolph presents to clinic today to discuss the possibility of a hereditary predisposition to cancer, genetic testing, and to further clarify her future cancer risks, as well as potential cancer risks for family members.   Ms. Shuttleworth is a 38 y.o. female with no personal history of cancer. She has a diagnosis of pseudotumor cerebri.  CANCER HISTORY:  Oncology History   No history exists.     RISK FACTORS:  Menarche was at age 79.  First live birth at age 27.  OCP use for approximately 5 years.  Ovaries intact: yes.  Hysterectomy: no.  Menopausal status: premenopausal.  HRT use: 0 years. Colonoscopy: yes; one polyp.  Coming back in 5 years.. Mammogram within the last year: yes. Number of breast biopsies: 0. Up to date with pelvic exams: yes. Any excessive radiation exposure in the past: no  Past Medical History:  Diagnosis Date  . Family history of breast cancer   . Family history of colon cancer   . Family history of colonic polyps   . Family history of ovarian cancer   . Pseudotumor cerebri     Past Surgical History:  Procedure Laterality Date  . CESAREAN SECTION     x 3    Social  History   Socioeconomic History  . Marital status: Married    Spouse name: Not on file  . Number of children: 4  . Years of education: Not on file  . Highest education level: Not on file  Occupational History  . Occupation: [employer]  . Financial resource strain: Not on file  . Food insecurity    Worry: Not on file    Inability: Not on file  . Transportation needs    Medical: Not on file    Non-medical: Not on file  Tobacco Use  . Smoking status: Former Smoker    Types: Cigarettes    Quit date: 03/05/2003    Years since quitting: 15.7  . Smokeless tobacco: Never Used  Substance and Sexual Activity  . Alcohol use: Yes    Alcohol/week: 1.0 standard drinks    Types: 1 Glasses of wine per week    Comment: soacial  . Drug use: No  . Sexual activity: Yes    Birth control/protection: None  Lifestyle  . Physical activity    Days per week: Not on file    Minutes per session: Not on file  . Stress: Not on file  Relationships  . Social Herbalist on phone: Not on file    Gets together: Not on file    Attends religious service: Not on  file    Active member of club or organization: Not on file    Attends meetings of clubs or organizations: Not on file    Relationship status: Not on file  Other Topics Concern  . Not on file  Social History Narrative   Regular Exercise -  YES           FAMILY HISTORY:  We obtained a detailed, 4-generation family history.  Significant diagnoses are listed below: Family History  Problem Relation Age of Onset  . Hypertension Maternal Aunt   . Ovarian cancer Maternal Aunt 17  . Breast cancer Maternal Aunt 60  . Hypothyroidism Maternal Aunt 45       thyroid nodules  . Hypertension Paternal Aunt   . Breast cancer Paternal Aunt 64  . Hypertension Mother   . Hypothyroidism Mother 45       thyroid nodules  . Arthritis Mother   . Hypertension Father   . Colon polyps Father   . Diabetes Father   .  Hyperlipidemia Father   . Hypertension Sister   . Hypothyroidism Sister 50       thyroid nodules  . Hypertension Maternal Grandmother   . Miscarriages / Stillbirths Maternal Grandmother   . Hypertension Maternal Grandfather   . Liver disease Maternal Grandfather        liver cancer secondary to ETOH  . Alcohol abuse Maternal Grandfather   . Diabetes Paternal Grandmother   . Hypertension Paternal Grandmother   . Colon cancer Paternal Grandmother 38  . Heart attack Paternal Grandfather   . Hypertension Paternal Grandfather   . Heart disease Paternal Grandfather 64       MI/CAD  . Kidney disease Paternal Aunt        secondary to sarcoidosis    The patient has three sons and two daughters who are cancer free.  She has one sister who also has pseudotumor cerebri, and a history of thyroid nodules that resulted in the removal of her thyroid.  Both parents are living.  The patient's mother had thyroid nodules that resulted in the removal of half of her thyroid.  She has one sister and three brothers. Her sister had breast cancer at 35. One brother died of kidney failure and another brother has liver and lung cancer, presumably from a primary cancer that spread.  The maternal grandparents both had cancer.  The grandfather has liver cancer, the grandmother had a benign pituitary brain tumor and the grandmother's sister had ovarian cancer at 17.  The patient's father has polyposis.  He has had more than 100 polyps, and has had a colectomy.  He has 7 brothers and 8 sisters.  One sister had breast cancer at 26.  The paternal grandparents are deceased.  The grandmother had colon cancer.  Ms. Fleet is unaware of previous family history of genetic testing for hereditary cancer risks. Patient's maternal ancestors are of African American descent, and paternal ancestors are of African American descent. There is no reported Ashkenazi Jewish ancestry. There is no known consanguinity.  GENETIC COUNSELING  ASSESSMENT: Ms. Prioleau is a 38 y.o. female with a family history of cancer which is somewhat suggestive of a cancer and predisposition to cancer given the combination of breast and ovarian cancer on the maternal side, as well as the combination of polyposis and colon cancer on the paternal side. We, therefore, discussed and recommended the following at today's visit.   DISCUSSION: We discussed that 5 - 10% of cancer is hereditary,  with each cancer associated with its own specific risk for being hereditary.  Breast cancer has about 5-10% of cases being hereditary, with most cases associated with BRCA mutations.  Ovarian cancer is closer to 20% of cases being hereditary, and colon cancer about 5-7% being hereditary with about 2-3% of those for polyposis conditions.  There are other genes that can be associated with hereditary cancer syndromes.  We discussed that testing is beneficial for several reasons including knowing how to follow individuals after completing their treatment,and understand if other family members could be at risk for cancer and allow them to undergo genetic testing.   We reviewed the characteristics, features and inheritance patterns of hereditary cancer syndromes. We also discussed genetic testing, including the appropriate family members to test, the process of testing, insurance coverage and turn-around-time for results. We discussed the implications of a negative, positive, carrier and/or variant of uncertain significant result. We recommended Ms. Mirando pursue genetic testing for the CancerNext-expanded+RNAinsight gene panel. The CancerNext-Expanded gene panel offered by [facility] and includes sequencing and rearrangement analysis for the following 67 genes: AIP, ALK, APC*, ATM*, BAP1, BARD1, BLM, BMPR1A, BRCA1*, BRCA2*, BRIP1*, CDH1*, CDK4, CDKN1B, CDKN2A, CHEK2*, DICER1, FANCC, FH, FLCN, GALNT12, HOXB13, MAX, MEN1, MET, MLH1*, MRE11A, MSH2*, MSH6*, MUTYH*, NBN, NF1*, NF2,  PALB2*, PHOX2B, PMS2*, POLD1, POLE, POT1, PRKAR1A, PTCH1, PTEN*, RAD50, RAD51C*, RAD51D*, RB1, RET, SDHA, SDHAF2, SDHB, SDHC, SDHD, SMAD4, SMARCA4, SMARCB1, SMARCE1, STK11, SUFU, TMEM127, TP53*, TSC1, TSC2, VHL and XRCC2 (sequencing and deletion/duplication); MITF (sequencing only); EPCAM and GREM1 (deletion/duplication only). DNA and RNA analyses performed for * genes.   Based on Ms. Redlich's family history of cancer, she meets medical criteria for genetic testing. Despite that she meets criteria, she may still have an out of pocket cost. We discussed that if her out of pocket cost for testing is over $100, the laboratory will call and confirm whether she wants to proceed with testing.  If the out of pocket cost of testing is less than $100 she will be billed by the genetic testing laboratory.   PLAN: After considering the risks, benefits, and limitations, Ms. Condie provided informed consent to pursue genetic testing and the blood sample was sent to [facility] for analysis of the CancerNext-Expanded+RNAinsight. Results should be available within approximately 2-3 weeks' time, at which point they will be disclosed by telephone to Ms. Enloe, as will any additional recommendations warranted by these results. Ms. Bambach will receive a summary of her genetic counseling visit and a copy of her results once available. This information will also be available in Epic.   Based on Ms. Howley's family history, we recommended her father, who was diagnosed with polyposis, have genetic counseling and testing. Ms. Overley will let us know if we can be of any assistance in coordinating genetic counseling and/or testing for this family member.   Lastly, we encouraged Ms. Ramp to remain in contact with cancer genetics annually so that we can continuously update the family history and inform her of any changes in cancer genetics and testing that may be of benefit for this family.   Ms.  Zellman questions were answered to her satisfaction today. Our contact information was provided should additional questions or concerns arise. Thank you for the referral and allowing [place] to share in the care of your patient.   Karen P. Florene Glen, [place], [facility] Licensed, Insurance risk surveyor [email or web address] phone: [phone]  The patient was seen for a total of 45 minutes in face-to-face genetic counseling.  This patient was discussed with Drs. Magrinat, Lindi Adie and/or Burr Medico who agrees with the above.    _______________________________________________________________________ For Office Staff:  Number of people involved in session: 1 Was an Intern/ student involved with case: no

## 2018-12-15 ENCOUNTER — Inpatient Hospital Stay: Payer: BC Managed Care – PPO

## 2018-12-15 ENCOUNTER — Other Ambulatory Visit: Payer: Self-pay

## 2018-12-15 ENCOUNTER — Telehealth: Payer: Self-pay | Admitting: Nurse Practitioner

## 2018-12-15 DIAGNOSIS — G932 Benign intracranial hypertension: Secondary | ICD-10-CM

## 2018-12-15 DIAGNOSIS — T781XXA Other adverse food reactions, not elsewhere classified, initial encounter: Secondary | ICD-10-CM

## 2018-12-15 LAB — CERVICOVAGINAL ANCILLARY ONLY
Bacterial Vaginitis (gardnerella): NEGATIVE
Candida Glabrata: NEGATIVE
Candida Vaginitis: NEGATIVE
Chlamydia: NEGATIVE
Comment: NEGATIVE
Comment: NEGATIVE
Comment: NEGATIVE
Comment: NEGATIVE
Comment: NEGATIVE
Comment: NORMAL
Neisseria Gonorrhea: NEGATIVE
Trichomonas: NEGATIVE

## 2018-12-15 NOTE — Telephone Encounter (Signed)
-----   Message from Shawnie Pons, LPN sent at [id number]  1:27 PM EDT ----- Pt agree for the referral,please advise.

## 2018-12-19 ENCOUNTER — Other Ambulatory Visit: Payer: BC Managed Care – PPO

## 2019-01-02 ENCOUNTER — Ambulatory Visit: Payer: BC Managed Care – PPO | Admitting: Gastroenterology

## 2019-01-05 ENCOUNTER — Ambulatory Visit
Admission: RE | Admit: 2019-01-05 | Discharge: 2019-01-05 | Disposition: A | Discharge status: Home / Self Care | Payer: BC Managed Care – PPO | Source: Ambulatory Visit | Attending: Nurse Practitioner | Admitting: Nurse Practitioner

## 2019-01-05 DIAGNOSIS — R3915 Urgency of urination: Secondary | ICD-10-CM

## 2019-01-05 DIAGNOSIS — R102 Pelvic and perineal pain: Secondary | ICD-10-CM

## 2019-01-06 ENCOUNTER — Encounter: Payer: Self-pay | Admitting: Nurse Practitioner

## 2019-01-06 DIAGNOSIS — R102 Pelvic and perineal pain: Secondary | ICD-10-CM

## 2019-01-06 DIAGNOSIS — R3915 Urgency of urination: Secondary | ICD-10-CM

## 2019-01-09 ENCOUNTER — Encounter: Payer: Self-pay | Admitting: Genetic Counselor

## 2019-01-09 DIAGNOSIS — Z1379 Encounter for other screening for genetic and chromosomal anomalies: Secondary | ICD-10-CM | POA: Insufficient documentation

## 2019-01-10 ENCOUNTER — Telehealth: Payer: Self-pay | Admitting: Genetic Counselor

## 2019-01-10 ENCOUNTER — Other Ambulatory Visit: Payer: Self-pay | Admitting: Nurse Practitioner

## 2019-01-10 DIAGNOSIS — B009 Herpesviral infection, unspecified: Secondary | ICD-10-CM

## 2019-01-10 NOTE — Telephone Encounter (Signed)
LM on VM that results are back and to please call. 

## 2019-01-11 ENCOUNTER — Ambulatory Visit: Payer: BC Managed Care – PPO | Admitting: Allergy

## 2019-01-11 ENCOUNTER — Other Ambulatory Visit: Payer: Self-pay

## 2019-01-11 ENCOUNTER — Encounter: Payer: Self-pay | Admitting: Allergy

## 2019-01-11 ENCOUNTER — Ambulatory Visit: Payer: Self-pay | Admitting: Genetic Counselor

## 2019-01-11 ENCOUNTER — Telehealth: Payer: Self-pay | Admitting: Genetic Counselor

## 2019-01-11 VITALS — BP 100/72 | HR 84 | Temp 97.7°F | Resp 10 | Ht 65.5 in | Wt 206.2 lb

## 2019-01-11 DIAGNOSIS — Z91018 Allergy to other foods: Secondary | ICD-10-CM | POA: Diagnosis not present

## 2019-01-11 DIAGNOSIS — T63441A Toxic effect of venom of bees, accidental (unintentional), initial encounter: Secondary | ICD-10-CM | POA: Insufficient documentation

## 2019-01-11 DIAGNOSIS — T63441D Toxic effect of venom of bees, accidental (unintentional), subsequent encounter: Secondary | ICD-10-CM | POA: Diagnosis not present

## 2019-01-11 DIAGNOSIS — Z1379 Encounter for other screening for genetic and chromosomal anomalies: Secondary | ICD-10-CM

## 2019-01-11 MED ORDER — EPINEPHRINE 0.3 MG/0.3ML IJ SOAJ: 0.3000 mg | Freq: Once | INTRAMUSCULAR | 1 refills | Status: AC

## 2019-01-11 NOTE — Patient Instructions (Addendum)
.   Get bloodwork:  o We are ordering labs, so please allow 1-2 weeks for the results to come back. o With the newly implemented Cures Act, the labs might be visible to you at the same time that they become visible to me. However, I will not address the results until all of the results are back, so please be patient.  o In the meantime, continue recommendations in your patient instructions, including avoidance measures (if applicable), until you hear from me.   Continue to avoid all mammalian meat (pork, beef, lamb)  Monitor symptoms after eating cow's milk product such as milk, butter, cheese, yogurt. If you have problems with your GI then may want to avoid as well.   I have prescribed epinephrine injectable and demonstrated proper use. For mild symptoms you can take over the counter antihistamines such as Benadryl and monitor symptoms closely. If symptoms worsen or if you have severe symptoms including breathing issues, throat closure, significant swelling, whole body hives, severe diarrhea and vomiting, lightheadedness then inject epinephrine and seek immediate medical care afterwards.  Emergency action plan given.  Follow up in 1 year or sooner if needed.

## 2019-01-11 NOTE — Assessment & Plan Note (Signed)
Noticed worsening cutaneous reactions after wasp stings the past few times. Used to have only localized reactions but now it's spreading out. No other associated symptoms. No previous work up.  Will get bloodwork for hymenoptera panel.  Continue to avoid.  I have prescribed epinephrine injectable and demonstrated proper use. For mild symptoms you can take over the counter antihistamines such as Benadryl and monitor symptoms closely. If symptoms worsen or if you have severe symptoms including breathing issues, throat closure, significant swelling, whole body hives, severe diarrhea and vomiting, lightheadedness then inject epinephrine and seek immediate medical care afterwards.  If bloodwork shows significant positives, will discuss allergy immunotherapy in more detail.

## 2019-01-11 NOTE — Progress Notes (Signed)
New Patient Note  RE: [name] MRN: [id number] DOB: 1981-01-17 Date of Office Visit: 01/11/2019  Referring provider: Flossie Buffy, NP Primary care provider: Flossie Buffy, NP  Chief Complaint: No chief complaint on file.  History of Present Illness: I had the pleasure of seeing Yvonne Johnson for initial evaluation at the [facility] on 01/11/2019. She is a 38 y.o. female, who is referred here by Nche, Charlene Brooke, NP for the evaluation of food allergy.   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   Patient has been having GI issues with abdominal pains, nausea, vomiting x 1 and trouble breathing. At first she thought it was her gallbladder and started to eliminate greasy/fried foods and meats from her diet and her symptoms improved. Then she tried to eat a steak and the symptoms returned. She did a Producer, television/film/video and was concerned about alpha gal at this point.   She initially noticed her GI symptoms occur a few months ago. Symptoms started within a few hours after ingestion and was in the form of abdominal pain and nausea. Denies any associated cofactors such as exertion, infection, NSAID use, or alcohol consumption. The symptoms lasted for up to a few hours at a time. She was not evaluated in ED. Since this episode, she does report other accidental exposures to pork about 11 days ago and had milder GI symptoms as above.  She does not have access to epinephrine autoinjector.   Past work up includes: 12/06/2018 alpha gal was positive.  Dietary History: patient has been eating other foods including milk, eggs, peanut, treenuts, sesame, shellfish, seafood, soy, wheat, chicken, [place], fruits and vegetables.   She reports reading labels and avoiding red meat in diet completely. No issues with dairy products.   Patient had multiple tick bites during the summer around July.  12/06/2018 Alpha Gal IgE* <0.10 kU/L 26.80High    Assessment and Plan:  Lyndia is a 38 y.o. female with: Allergy to alpha-gal Noticed abdominal issues a few months ago and initially thought it was due to her eating greasy/fried foods affecting her gallbladder. She eliminated these foods including meat and was doing much better. Then she tried to eat a steak one day and the symptoms returned. She had alpha gal bloodwork in October 2020 which was positive. She also had multiple tick bites this past summer. Doing much better since avoiding all red meat. No issues with dairy products.  Continue to avoid all mammalian meat (pork, beef, lamb)  Monitor symptoms after eating cow's milk product such as milk, butter, cheese, yogurt. If you have problems with your GI then may want to avoid as well.   I have prescribed epinephrine injectable and demonstrated proper use. For mild symptoms you can take over the counter antihistamines such as Benadryl and monitor symptoms closely. If symptoms worsen or if you have severe symptoms including breathing issues, throat closure, significant swelling, whole body hives, severe diarrhea and vomiting, lightheadedness then inject epinephrine and seek immediate medical care afterwards.  Emergency action plan given.  Will recheck alpha gal level in 1 year and if negative then will schedule for in office food challenge.   Bee sting reaction Noticed worsening cutaneous reactions after wasp stings the past few times. Used to have only localized reactions but now it's spreading out. No other associated symptoms. No previous work up.  Will get bloodwork for hymenoptera panel.  Continue to avoid.  I have prescribed epinephrine injectable and  demonstrated proper use. For mild symptoms you can take over the counter antihistamines such as Benadryl and monitor symptoms closely. If symptoms worsen or if you have severe symptoms including breathing issues, throat closure, significant swelling, whole body hives, severe diarrhea and vomiting,  lightheadedness then inject epinephrine and seek immediate medical care afterwards.  If bloodwork shows significant positives, will discuss allergy immunotherapy in more detail.   Return in about 1 year (around 01/11/2020).  Meds ordered this encounter  Medications  . EPINEPHrine (EPIPEN 2-PAK) 0.3 mg/0.3 mL IJ SOAJ injection    Sig: Inject 0.3 mLs (0.3 mg total) into the muscle once for 1 dose.    Dispense:  2 each    Refill:  1    Lab Orders     Tryptase     Allergen Hymenoptera Panel  Other allergy screening: Asthma: no Rhino conjunctivitis: no Food allergy: yes Medication allergy: yes  Penicillin - hives Promethazine - nausea and vomiting Hymenoptera allergy:   Large localized/cutaneous reactions which is worsening with every stings.  Urticaria: no Eczema:no History of recurrent infections suggestive of immunodeficency: no  Diagnostics: None.  Past Medical History: Patient Active Problem List   Diagnosis Date Noted  . Bee sting reaction 01/11/2019  . Allergy to alpha-gal 01/11/2019  . Genetic testing 01/09/2019  . Family history of breast cancer   . Family history of ovarian cancer   . Family history of colon cancer   . Family history of colonic polyps   . Colon polyp 12/03/2017  . Class 1 obesity due to excess calories without serious comorbidity with body mass index (BMI) of 32.0 to 32.9 in adult 12/25/2016  . Herpes simplex 12/25/2016  . CN (constipation) 04/17/2014  . Generalized abdominal pain 04/17/2014  . Abdominal bloating 04/17/2014  . Nipple discharge 07/10/2013  . Bilateral low back pain 07/10/2013  . Abdominal pain, left lower quadrant 07/10/2013  . Weight loss, unintentional 07/10/2013  . Pseudotumor cerebri    Past Medical History:  Diagnosis Date  . Family history of breast cancer   . Family history of colon cancer   . Family history of colonic polyps   . Family history of ovarian cancer   . Idiopathic intracranial hypertension   .  Pseudotumor cerebri    Past Surgical History: Past Surgical History:  Procedure Laterality Date  . CESAREAN SECTION     x 3   Medication List:  Current Outpatient Medications  Medication Sig Dispense Refill  . acetaZOLAMIDE (DIAMOX) 500 MG capsule Take 1 capsule (500 mg total) by mouth 2 (two) times daily. 180 capsule 3  . acyclovir (ZOVIRAX) 400 MG tablet TAKE 1 TABLET(400 MG) BY MOUTH FOUR TIMES DAILY FOR 7 DAYS 28 tablet 1  . EPINEPHrine (EPIPEN 2-PAK) 0.3 mg/0.3 mL IJ SOAJ injection Inject 0.3 mLs (0.3 mg total) into the muscle once for 1 dose. 2 each 1   No current facility-administered medications for this visit.    Facility-Administered Medications Ordered in Other Visits  Medication Dose Route Frequency Provider Last Rate Last Dose  . gadopentetate dimeglumine (MAGNEVIST) injection 20 mL  20 mL Intravenous Once PRN Star Age, MD       Allergies: Allergies  Allergen Reactions  . Penicillins Hives  . Promethazine Nausea And Vomiting, Other (See Comments) and Palpitations   Social History: Social History   Socioeconomic History  . Marital status: Married    Spouse name: Not on file  . Number of children: 4  . Years of education: Not  on file  . Highest education level: Not on file  Occupational History  . Occupation: [employer]  . Financial resource strain: Not on file  . Food insecurity    Worry: Not on file    Inability: Not on file  . Transportation needs    Medical: Not on file    Non-medical: Not on file  Tobacco Use  . Smoking status: Former Smoker    Types: Cigarettes    Quit date: 03/05/2003    Years since quitting: 15.8  . Smokeless tobacco: Never Used  Substance and Sexual Activity  . Alcohol use: Yes    Alcohol/week: 1.0 standard drinks    Types: 1 Glasses of wine per week    Comment: social   . Drug use: No  . Sexual activity: Yes    Birth control/protection: None  Lifestyle  . Physical activity    Days per week: Not  on file    Minutes per session: Not on file  . Stress: Not on file  Relationships  . Social Herbalist on phone: Not on file    Gets together: Not on file    Attends religious service: Not on file    Active member of club or organization: Not on file    Attends meetings of clubs or organizations: Not on file    Relationship status: Not on file  Other Topics Concern  . Not on file  Social History Narrative   Regular Exercise -  YES         Lives in a home built in [place]. Smoking: denies Occupation: Administrator - biology  Environmental HistoryFreight forwarder in the house: no Carpet in the family room: no Carpet in the bedroom: no Heating: electric Cooling: heat pump Pet: yes 3 dogs - 2 puppies x 6 months, 1 year  Family History: Family History  Problem Relation Age of Onset  . Asthma Daughter   . Asthma Son   . Hypertension Maternal Aunt   . Ovarian cancer Maternal Aunt 56  . Breast cancer Maternal Aunt 60  . Hypothyroidism Maternal Aunt 45       thyroid nodules  . Hypertension Paternal Aunt   . Breast cancer Paternal Aunt 64  . Hypertension Mother   . Hypothyroidism Mother 50       thyroid nodules  . Arthritis Mother   . Hypertension Father   . Colon polyps Father   . Diabetes Father   . Hyperlipidemia Father   . Hypertension Sister   . Hypothyroidism Sister 45       thyroid nodules  . Asthma Sister   . Hypertension Maternal Grandmother   . Miscarriages / Stillbirths Maternal Grandmother   . Hypertension Maternal Grandfather   . Liver disease Maternal Grandfather        liver cancer secondary to ETOH  . Alcohol abuse Maternal Grandfather   . Diabetes Paternal Grandmother   . Hypertension Paternal Grandmother   . Colon cancer Paternal Grandmother 80  . Heart attack Paternal Grandfather   . Hypertension Paternal Grandfather   . Heart disease Paternal Grandfather 59       MI/CAD  . Kidney disease Paternal Aunt        secondary to  sarcoidosis   Review of Systems  Constitutional: Negative for appetite change, chills, fever and unexpected weight change.  HENT: Negative for congestion and rhinorrhea.   Eyes: Negative for itching.  Respiratory: Negative for cough,  chest tightness, shortness of breath and wheezing.   Cardiovascular: Negative for chest pain.  Gastrointestinal: Negative for abdominal pain.  Genitourinary: Negative for difficulty urinating.  Skin: Negative for rash.  Allergic/Immunologic: Positive for food allergies. Negative for environmental allergies.  Neurological: Negative for headaches.   Objective: BP 100/72   Pulse 84   Temp 97.7 F (36.5 C) (Temporal)   Resp 10   Ht 5' 5.5" (1.664 m)   Wt 206 lb 3.2 oz (93.5 kg)   SpO2 98%   BMI 33.79 kg/m  Body mass index is 33.79 kg/m. Physical Exam  Constitutional: She is oriented to person, place, and time. She appears well-developed and well-nourished.  HENT:  Head: Normocephalic and atraumatic.  Right Ear: External ear normal.  Left Ear: External ear normal.  Nose: Nose normal.  Mouth/Throat: Oropharynx is clear and moist.  Eyes: Conjunctivae and EOM are normal.  Neck: Neck supple.  Cardiovascular: Normal rate, regular rhythm and normal heart sounds. Exam reveals no gallop and no friction rub.  No murmur heard. Pulmonary/Chest: Effort normal and breath sounds normal. She has no wheezes. She has no rales.  Abdominal: Soft.  Neurological: She is alert and oriented to person, place, and time.  Skin: Skin is warm. No rash noted.  Psychiatric: She has a normal mood and affect. Her behavior is normal.  Nursing note and vitals reviewed.  The plan was reviewed with the patient/family, and all questions/concerned were addressed.  It was my pleasure to see Lurdes today and participate in her care. Please feel free to contact me with any questions or concerns.  Sincerely,  Rexene Alberts, DO Allergy & Immunology  [facility] office: [phone] [facility] office: [place] office: [phone]

## 2019-01-11 NOTE — Progress Notes (Signed)
HPI:  Yvonne Johnson was previously seen in the [place] clinic due to a family history of cancer and concerns regarding a hereditary predisposition to cancer. Please refer to our prior cancer genetics clinic note for more information regarding our discussion, assessment and recommendations, at the time. Yvonne Johnson recent genetic test results were disclosed to her, as were recommendations warranted by these results. These results and recommendations are discussed in more detail below.  CANCER HISTORY:  Oncology History   No history exists.    FAMILY HISTORY:  We obtained a detailed, 4-generation family history.  Significant diagnoses are listed below: Family History  Problem Relation Age of Onset   Asthma Daughter    Asthma Son    Hypertension Maternal Aunt    Ovarian cancer Maternal Aunt 50   Breast cancer Maternal Aunt 60   Hypothyroidism Maternal Aunt 45       thyroid nodules   Hypertension Paternal Aunt    Breast cancer Paternal Aunt 8   Hypertension Mother    Hypothyroidism Mother 73       thyroid nodules   Arthritis Mother    Hypertension Father    Colon polyps Father    Diabetes Father    Hyperlipidemia Father    Hypertension Sister    Hypothyroidism Sister 51       thyroid nodules   Asthma Sister    Hypertension Maternal Grandmother    Miscarriages / Stillbirths Maternal Grandmother    Hypertension Maternal Grandfather    Liver disease Maternal Grandfather        liver cancer secondary to ETOH   Alcohol abuse Maternal Grandfather    Diabetes Paternal Grandmother    Hypertension Paternal Grandmother    Colon cancer Paternal Grandmother 58   Heart attack Paternal Grandfather    Hypertension Paternal Grandfather    Heart disease Paternal Grandfather 35       MI/CAD   Kidney disease Paternal Aunt        secondary to sarcoidosis    The patient has three sons and two daughters who are cancer free.  She has one  sister who also has pseudotumor cerebri, and a history of thyroid nodules that resulted in the removal of her thyroid.  Both parents are living.  The patient's mother had thyroid nodules that resulted in the removal of half of her thyroid.  She has one sister and three brothers. Her sister had breast cancer at 7. One brother died of kidney failure and another brother has liver and lung cancer, presumably from a primary cancer that spread.  The maternal grandparents both had cancer.  The grandfather has liver cancer, the grandmother had a benign pituitary brain tumor and the grandmother's sister had ovarian cancer at 3.  The patient's father has polyposis.  He has had more than 100 polyps, and has had a colectomy.  He has 7 brothers and 8 sisters.  One sister had breast cancer at 32.  The paternal grandparents are deceased.  The grandmother had colon cancer.  Ms. Raneri is unaware of previous family history of genetic testing for hereditary cancer risks. Patient's maternal ancestors are of African American descent, and paternal ancestors are of African American descent. There is no reported Ashkenazi Jewish ancestry. There is no known consanguinity.    GENETIC TEST RESULTS: Genetic testing reported out on January 06, 2019 through the CancerNext-Expanded+RNAinsight cancer panel found no pathogenic mutations. The CancerNext-Expanded gene panel offered by [facility] and includes sequencing and  rearrangement analysis for the following 77 genes: AIP, ALK, APC*, ATM*, AXIN2, BAP1, BARD1, BLM, BMPR1A, BRCA1*, BRCA2*, BRIP1*, CDC73, CDH1*, CDK4, CDKN1B, CDKN2A, CHEK2*, CTNNA1, DICER1, FANCC, FH, FLCN, GALNT12, KIF1B, LZTR1, MAX, MEN1, MET, MLH1*, MSH2*, MSH3, MSH6*, MUTYH*, NBN, NF1*, NF2, NTHL1, PALB2*, PHOX2B, PMS2*, POT1, PRKAR1A, PTCH1, PTEN*, RAD51C*, RAD51D*, RB1, RECQL, RET, SDHA, SDHAF2, SDHB, SDHC, SDHD, SMAD4, SMARCA4, SMARCB1, SMARCE1, STK11, SUFU, TMEM127, TP53*, TSC1, TSC2, VHL and XRCC2  (sequencing and deletion/duplication); EGFR, EGLN1, HOXB13, KIT, MITF, PDGFRA, POLD1, and POLE (sequencing only); EPCAM and GREM1 (deletion/duplication only). DNA and RNA analyses performed for * genes. The test report has been scanned into EPIC and is located under the Molecular Pathology section of the Results Review tab.  A portion of the result report is included below for reference.     We discussed with [name] that because current genetic testing is not perfect, it is possible there may be a gene mutation in one of these genes that current testing cannot detect, but that chance is small.  We also discussed, that there could be another gene that has not yet been discovered, or that we have not yet tested, that is responsible for the cancer diagnoses in the family. It is also possible there is a hereditary cause for the cancer in the family that [name] did not inherit and therefore was not identified in her testing.  Therefore, it is important to remain in touch with cancer genetics in the future so that we can continue to offer [name] the most up to date genetic testing.   Genetic testing did identify two Variants of uncertain significance (VUS) - one in the BRCA2 gene called p.F2058I, and a second in the PMS2 gene called p.C192G  At this time, it is unknown if these variants are associated with increased cancer risk or if they are normal findings, but most variants such as these get reclassified to being inconsequential. They should not be used to make medical management decisions. With time, we suspect the lab will determine the significance of these variants, if any. If we do learn more about them, we will try to contact [name] to discuss it further. However, it is important to stay in touch with [place] periodically and keep the address and phone number up to date.  ADDITIONAL GENETIC TESTING: We discussed with [name] that her genetic testing was fairly extensive.  If  there are genes identified to increase cancer risk that can be analyzed in the future, we would be happy to discuss and coordinate this testing at that time.    CANCER SCREENING RECOMMENDATIONS: [name]'s test result is considered negative (normal).  This means that we have not identified a hereditary cause for her family history of cancer at this time. Most cancers happen by chance and this negative test suggests that her cancer may fall into this category.    While reassuring, this does not definitively rule out a hereditary predisposition to cancer. It is still possible that there could be genetic mutations that are undetectable by current technology. There could be genetic mutations in genes that have not been tested or identified to increase cancer risk.  Therefore, it is recommended she continue to follow the cancer management and screening guidelines provided by her primary healthcare provider.   An individual's cancer risk and medical management are not determined by genetic test results alone. Overall cancer risk assessment incorporates additional factors, including personal medical history, family history, and any available genetic  information that may result in a personalized plan for cancer prevention and surveillance  RECOMMENDATIONS FOR FAMILY MEMBERS:  Individuals in this family might be at some increased risk of developing cancer, over the general population risk, simply due to the family history of cancer.  We recommended women in this family have a yearly mammogram beginning at age 4, or 32 years younger than the earliest onset of cancer, an annual clinical breast exam, and perform monthly breast self-exams. Women in this family should also have a gynecological exam as recommended by their primary provider. All family members should have a colonoscopy by age 58.  It is also possible there is a hereditary cause for the cancer in Ms. Jaskiewicz's family that she did not inherit and  therefore was not identified in her.  Based on Ms. Vallejo's family history, we recommended her father, who was diagnosed with polyposis, have genetic counseling and testing. Ms. Bagg will let us know if we can be of any assistance in coordinating genetic counseling and/or testing for this family member.   FOLLOW-UP: Lastly, we discussed with Ms. Boyadjian that cancer genetics is a rapidly advancing field and it is possible that new genetic tests will be appropriate for her and/or her family members in the future. We encouraged her to remain in contact with cancer genetics on an annual basis so we can update her personal and family histories and let her know of advances in cancer genetics that may benefit this family.   Our contact number was provided. Ms. Persichetti questions were answered to her satisfaction, and she knows she is welcome to call us at anytime with additional questions or concerns.   Roma Kayser, [place], [facility] Licensed, Certified Genetic Counselor [email or web address]

## 2019-01-11 NOTE — Assessment & Plan Note (Signed)
Noticed abdominal issues a few months ago and initially thought it was due to her eating greasy/fried foods affecting her gallbladder. She eliminated these foods including meat and was doing much better. Then she tried to eat a steak one day and the symptoms returned. She had alpha gal bloodwork in October 2020 which was positive. She also had multiple tick bites this past summer. Doing much better since avoiding all red meat. No issues with dairy products.  Continue to avoid all mammalian meat (pork, beef, lamb)  Monitor symptoms after eating cow's milk product such as milk, butter, cheese, yogurt. If you have problems with your GI then may want to avoid as well.   I have prescribed epinephrine injectable and demonstrated proper use. For mild symptoms you can take over the counter antihistamines such as Benadryl and monitor symptoms closely. If symptoms worsen or if you have severe symptoms including breathing issues, throat closure, significant swelling, whole body hives, severe diarrhea and vomiting, lightheadedness then inject epinephrine and seek immediate medical care afterwards.  Emergency action plan given.  Will recheck alpha gal level in 1 year and if negative then will schedule for in office food challenge.

## 2019-01-11 NOTE — Telephone Encounter (Signed)
Revealed negative genetic testing.  Discussed that we do not know whythere is cancer in the family. It could be due to a different gene that we are not testing, or maybe our current technology may not be able to pick something up.  It will be important for her to keep in contact with genetics to keep up with whether additional testing may be needed.   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  These will not change medical management.

## 2019-01-13 ENCOUNTER — Encounter: Payer: Self-pay | Admitting: Allergy

## 2019-01-13 LAB — ALLERGEN HYMENOPTERA PANEL
Bumblebee: 0.68 kU/L — AB
Honeybee IgE: 0.59 kU/L — AB
Hornet, White Face, IgE: 8.4 kU/L — AB
Hornet, Yellow, IgE: 3.73 kU/L — AB
Paper Wasp IgE: 11.7 kU/L — AB
Yellow Jacket, IgE: 12.4 kU/L — AB

## 2019-01-13 LAB — TRYPTASE: Tryptase: 7.8 ug/L (ref 2.2–13.2)

## 2019-01-19 ENCOUNTER — Other Ambulatory Visit: Payer: Self-pay

## 2019-01-19 ENCOUNTER — Ambulatory Visit
Admission: RE | Admit: 2019-01-19 | Discharge: 2019-01-19 | Disposition: A | Discharge status: Home / Self Care | Payer: BC Managed Care – PPO | Source: Ambulatory Visit | Attending: Nurse Practitioner | Admitting: Nurse Practitioner

## 2019-01-19 DIAGNOSIS — R3915 Urgency of urination: Secondary | ICD-10-CM

## 2019-01-19 DIAGNOSIS — R102 Pelvic and perineal pain: Secondary | ICD-10-CM

## 2019-01-19 MED ORDER — IOPAMIDOL (ISOVUE-300) INJECTION 61%
100.0000 mL | Freq: Once | INTRAVENOUS | Status: AC | PRN
  Administered 2019-01-19: 100 mL via INTRAVENOUS

## 2019-05-14 ENCOUNTER — Telehealth: Payer: BC Managed Care – PPO | Admitting: Family

## 2019-05-14 DIAGNOSIS — J019 Acute sinusitis, unspecified: Secondary | ICD-10-CM

## 2019-05-14 MED ORDER — DOXYCYCLINE HYCLATE 100 MG PO TABS: 100.0000 mg | ORAL_TABLET | Freq: Two times a day (BID) | ORAL | 0 refills | Status: DC

## 2019-05-14 NOTE — Progress Notes (Signed)

## 2019-06-21 ENCOUNTER — Telehealth: Payer: Self-pay | Admitting: Neurology

## 2019-06-21 ENCOUNTER — Encounter: Payer: Self-pay | Admitting: Nurse Practitioner

## 2019-06-21 NOTE — Telephone Encounter (Signed)
Pt called stating that her PCP used to fill her acetaZOLAMIDE (DIAMOX) 500 MG capsule but now she has changed PCP and the new one informed her that she needs to get her acetaZOLAMIDE (DIAMOX) 500 MG capsule filled by her Neurologist. Please advise.

## 2019-06-21 NOTE — Telephone Encounter (Signed)
I have reached out to the pt and advised we can start refilling her diamox but we would need to see her for an appointment since her last appointment was in 2018.  Pt's pcp has changed and her previous PCP was refilling this medication for her, but her new pcp would like to defer refill to neurologist.  Pt scheduled for 06/28/2019 with Dr. Rexene Alberts at 1 pm check in time of 1230 pm.

## 2019-06-27 ENCOUNTER — Telehealth: Payer: Self-pay | Admitting: Family Medicine

## 2019-06-27 NOTE — Telephone Encounter (Signed)
Per patient call/request 06/22/2019, request to change PCP from C. Nche to new PCP who will help manage her condition.  Has an appointment this week with specialist.  Discussed with both providers, who have approved the change.

## 2019-06-28 ENCOUNTER — Encounter: Payer: Self-pay | Admitting: Neurology

## 2019-06-28 ENCOUNTER — Other Ambulatory Visit: Payer: Self-pay

## 2019-06-28 ENCOUNTER — Ambulatory Visit: Payer: BC Managed Care – PPO | Admitting: Neurology

## 2019-06-28 VITALS — BP 130/86 | HR 78 | Ht 68.0 in | Wt 211.3 lb

## 2019-06-28 DIAGNOSIS — G932 Benign intracranial hypertension: Secondary | ICD-10-CM | POA: Diagnosis not present

## 2019-06-28 MED ORDER — ACETAZOLAMIDE ER 500 MG PO CP12: 500.0000 mg | ORAL_CAPSULE | Freq: Two times a day (BID) | ORAL | 3 refills | Status: AC

## 2019-06-28 NOTE — Progress Notes (Signed)
Subjective:    Patient ID: Yvonne Johnson is a 39 y.o. female.  HPI     Interim history:   Yvonne Johnson is a 39 year old right-handed woman with an underlying medical history of obesity who presents for follow-up consultation of her pseudotumor cerebri. The patient is unaccompanied today and presents after nearly 3 years. I last saw her on 07/22/2016, at which time she reported feeling much better after restarting her Diamox.  She ended up needing a blood patch for postop a headache but other than that she has done well.  She had seen [facility] recently and reported that her eye exam was much better.  She was advised to continue with Diamox.  Today, 06/28/2019: She reports that she has been continuing with her Diamox through her primary care physician who refilled her prescriptions.  She reports that her new primary care physician would like for her to get her Diamox prescription through neurology.  Her last eye examination was in Jan or Feb 2020 and was stable at the time, she does get new prescription eyeglasses every year but her prescription typically does not change.  She is due for an eye examination and intends to schedule with Dr. Mabeline Johnson.  She has had no new symptoms, tolerates the Diamox, no side effects, in particular, she has had no flareup in her headaches and no blurry vision.  She has not had any other medication changes.   The patient's allergies, current medications, family history, past medical history, past social history, past surgical history and problem list were reviewed and updated as appropriate.    Previously (copied from previous notes for reference):   I first met her on 04/23/2016 at the request of her optometrist, at which time she reported a prior diagnosis of pseudotumor cerebri, and she was found to have bilateral papilledema recently. She was originally diagnosed with pseudotumor cerebri when she was 39 years old. She had also experienced some weight gain  with time. I suggested she restart taking Diamox. She called in the interim, requesting a change in her Diamox prescription for better tolerance. During her first visit, I suggested we proceed with MRI brain and spinal tap. She had a brain MRI with and without contrast on 05/06/2016 which I reviewed:IMPRESSION:  This MRI of the brain without contrast shows the following: 1.    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 Although these can be incidental findings, the combination is most consistent with elevated intracranial hypertension as would be seen with pseudotumor cerebri. 2.    Minimal mucoperiosteal thickening of the right maxillary sinus and ethmoid air cells consistent with minimal chronic sinusitis. 3.    There are no acute findings.    There is a normal enhancement pattern.   We called her with her test results.   She had a fluoroscopic-guided lumbar puncture on 05/08/2016: IMPRESSION: 1. Technically successful lumbar puncture under fluoroscopy. 2. Elevated opening pressure, 36 cm H2O.   We called her with her test results.   She called in the interim on 05/15/2016 for a five-day history of post LP headache. She talk to the on-call doctor at the time and was advised to proceed with a blood patch.     04/23/2016: She presented to your eye care center on 04/18/2016 with blurry vision and headaches. She had gone to urgent care recently with a sinus infection. She has a personal history of pseudotumor cerebri and a family history of pseudotumor  as well. You noted slightly blurred disc margins right eye greater than left, lenses unremarkable to both eyes, no retinal problems, and corrected vision of 20/20 bilaterally. She reports a prior diagnosis of pseudotumor cerebri when she was 39 years old. She was incidentally found to have papilledema at the time of her original Dx. she was very athletic at this time, denies any headaches at the time. This time around,  about 4 weeks ago she woke up with severe headache, since then she has had intermittent right-sided. Orbital and retro-orbital headaches. She snores some but intermittently and not loudly and her husband has not noticed any pauses in her breathing while she is asleep. She tries to rest enough, she has no significant recent increase in stressors, no recent medication changes, weight has been stable for the most part. She works full-time at [employer]. She has 4 children which includes a 20 year old son, 18 year old twin boys and an 51-year-old daughter. When she was first diagnosed with pseudotumor at age 50 she took Diamox, she took it off and on for years, has not taken it in the past 5-7 years. She had in between her pregnancies some flareup of symptoms and took her Diamox at the time. She had workup and treatment previously at [facility]. Prior test results and records are not available for my review today. She reports that she had MRI testing at the time and a lumbar puncture which showed an abnormal pressure. She used to see ophthalmology in the past. Her headaches are new to her. She did not have any headaches before, she has no personal history of migraines. Her older sister also has pseudotumor cerebri. She has been taking ibuprofen on a nearly daily basis. She smokes 4 cigarettes per day on average, she drinks alcohol occasionally about twice per month and drinks one cup of coffee and one soda per day. She has not noticed any one-sided weakness, numbness, slurring of speech or droopy face.  Her Past Medical History Is Significant For: Past Medical History:  Diagnosis Date  . Family history of breast cancer   . Family history of colon cancer   . Family history of colonic polyps   . Family history of ovarian cancer   . Idiopathic intracranial hypertension   . Pseudotumor cerebri     Her Past Surgical History Is Significant For: Past Surgical History:  Procedure Laterality Date  . CESAREAN SECTION      x 3    Her Family History Is Significant For: Family History  Problem Relation Age of Onset  . Asthma Daughter   . Asthma Son   . Hypertension Maternal Aunt   . Ovarian cancer Maternal Aunt 77  . Breast cancer Maternal Aunt 60  . Hypothyroidism Maternal Aunt 45       thyroid nodules  . Hypertension Paternal Aunt   . Breast cancer Paternal Aunt 7  . Hypertension Mother   . Hypothyroidism Mother 73       thyroid nodules  . Arthritis Mother   . Hypertension Father   . Colon polyps Father   . Diabetes Father   . Hyperlipidemia Father   . Hypertension Sister   . Hypothyroidism Sister 88       thyroid nodules  . Asthma Sister   . Hypertension Maternal Grandmother   . Miscarriages / Stillbirths Maternal Grandmother   . Hypertension Maternal Grandfather   . Liver disease Maternal Grandfather        liver cancer secondary to ETOH  .  Alcohol abuse Maternal Grandfather   . Diabetes Paternal Grandmother   . Hypertension Paternal Grandmother   . Colon cancer Paternal Grandmother 49  . Heart attack Paternal Grandfather   . Hypertension Paternal Grandfather   . Heart disease Paternal Grandfather 34       MI/CAD  . Kidney disease Paternal Aunt        secondary to sarcoidosis    Her Social History Is Significant For: Social History   Socioeconomic History  . Marital status: Married    Spouse name: Not on file  . Number of children: 4  . Years of education: Not on file  . Highest education level: Not on file  Occupational History  . Occupation: [employer]  Tobacco Use  . Smoking status: Former Smoker    Types: Cigarettes    Quit date: 03/05/2003    Years since quitting: 16.3  . Smokeless tobacco: Never Used  Substance and Sexual Activity  . Alcohol use: Yes    Alcohol/week: 1.0 standard drinks    Types: 1 Glasses of wine per week    Comment: social   . Drug use: No  . Sexual activity: Yes    Birth control/protection: None  Other Topics Concern  . Not on  file  Social History Narrative   Regular Exercise -  YES         Social Determinants of Health   Financial Resource Strain:   . Difficulty of Paying Living Expenses:   Food Insecurity:   . Worried About Charity fundraiser in the Last Year:   . Arboriculturist in the Last Year:   Transportation Needs:   . Film/video editor (Medical):   Marland Kitchen Lack of Transportation (Non-Medical):   Physical Activity:   . Days of Exercise per Week:   . Minutes of Exercise per Session:   Stress:   . Feeling of Stress :   Social Connections:   . Frequency of Communication with Friends and Family:   . Frequency of Social Gatherings with Friends and Family:   . Attends Religious Services:   . Active Member of Clubs or Organizations:   . Attends Archivist Meetings:   Marland Kitchen Marital Status:     Her Allergies Are:  Allergies  Allergen Reactions  . Penicillins Hives  . Promethazine Nausea And Vomiting, Other (See Comments) and Palpitations  :   Her Current Medications Are:  Outpatient Encounter Medications as of 06/28/2019  Medication Sig  . doxycycline (VIBRA-TABS) 100 MG tablet Take 1 tablet (100 mg total) by mouth 2 (two) times daily. (Patient taking differently: Take 100 mg by mouth 2 (two) times daily. PRN)  . acetaZOLAMIDE (DIAMOX) 500 MG capsule Take 1 capsule (500 mg total) by mouth 2 (two) times daily. (Patient not taking: Reported on 06/28/2019)  . [DISCONTINUED] acyclovir (ZOVIRAX) 400 MG tablet TAKE 1 TABLET(400 MG) BY MOUTH FOUR TIMES DAILY FOR 7 DAYS (Patient not taking: Reported on 06/28/2019)   Facility-Administered Encounter Medications as of 06/28/2019  Medication  . gadopentetate dimeglumine (MAGNEVIST) injection 20 mL  :  Review of Systems:  Out of a complete 14 point review of systems, all are reviewed and negative with the exception of these symptoms as listed below: Review of Systems  Neurological:       Pt is here for f/u to have Diamox refilled. Pt's PCP has  changed and is not comfortable refilling rx.  Last appt was in 2018.  Pt is hopefully  changing PCP's in the next few months and new pcp will be comfortable refilling Diamox.     Objective:  Neurological Exam  Physical Exam Physical Examination:   Vitals:   06/28/19 1259  BP: 130/86  Pulse: 78  SpO2: 95%   General Examination: The patient is a very pleasant 39 y.o. female in no acute distress. She appears well-developed and well-nourished and well groomed.   HEENT:Normocephalic, atraumatic, pupils are equal, round and reactive to light and accommodation. Funduscopic exam is normal with sharp disc margins noted, no telltale papilledema. No photophobia. Extraocular tracking is good without limitation to gaze excursion or nystagmus noted. Normal smooth pursuit is noted. Visual fields are full by finger perimetry.  Hearing is grossly intact. Face is symmetric with normal facial animation and normal facial sensation. Speech is clear with no dysarthria noted. There is no hypophonia. There is no lip, neck/head, jaw or voice tremor. Neck is supple with full range of passive and active motion. There are no carotid bruits on auscultation.   Chest:Clear to auscultation without wheezing, rhonchi or crackles noted.  Heart:S1+S2+0, regular and normal without murmurs, rubs or gallops noted.   Abdomen:Soft, non-distended.   Extremities:There is nopitting edema in the distal lower extremities bilaterally.   Skin: Warm and dry without trophic changes noted.  Musculoskeletal: exam reveals no obvious joint deformities, tenderness or joint swelling or erythema.   Neurologically:  Mental status: The patient is awake, alert and oriented in all 4 spheres. Herimmediate and remote memory, attention, language skills and fund of knowledge are appropriate. There is no evidence of aphasia, agnosia, apraxia or anomia. Speech is clear with normal prosody and enunciation. Thought process is linear.  Mood is normaland affect is normal.  Cranial nerves II - XII are as described above under HEENT exam.  Motor exam: Normal bulk, strength and tone is noted. There is no drift, tremor or rebound. Romberg is negative. Reflexes are 2+ throughout, toes downgoing. Fine motor skills and coordination: intact with normal finger taps, normal hand movements, normal rapid alternating patting, normal foot taps and normal foot agility.  Cerebellar testing: No dysmetria or intention tremor on finger to nose testing. Heel to shin is unremarkable bilaterally. There is no truncal or gait ataxia.  Sensory exam: intact to light touch in the upper and lower extremities.  Gait, station and balance: Shestands easily. No veering to one side is noted. No leaning to one side is noted. Posture is age-appropriate and stance is narrow based. Gait shows normalstride length and normalpace. No problems turning are noted. Tandem walk is unremarkable.   Assessment and Plan:   In summary, Nikoletta G Slaughteris a very pleasant 39 year old femalewith a history of obesity and a prior diagnosis of pseudotumor cerebri at age 86, who presents for follow-up consultation of her pseudotumor cerebri, after nearly 3 years.  She has been on Diamox 500 mg capsule SR twice daily through her primary care physician and have been doing well.  Her last eye examination per her report was in January of February of last year with Dr. Mabeline Johnson.  She is due for an eye exam.  She is encouraged to get checked out at least on a yearly basis as it is important to track healthy eye examination for pseudotumor cerebri.  She has had headaches in the past and blurry vision.  A spinal tap in March 2018 showed an elevated opening pressure and after that she was started on Diamox 500 mg twice daily with good  tolerance and good results.  We had to switch her from the tablets to the capsules and she had done well over the years.  She did have an empty sella phenomenon  on the brain MRI, a finding that can be seen in patients with pseudotumor, not a definitive diagnostic sign but supportive. Otherwise her brain MRI was normal in the past. Her exam looks good, I'm pleased to see that she has been doing well. I suggested a 1 year checkup with one of her nurse practitioners. I renewed her Diamox prescription at this time. I answered all her questions today and she was in agreement.

## 2019-06-28 NOTE — Patient Instructions (Signed)
I am glad to hear you have been doing well on the Diamox.  I have renewed your prescription for 90 days with refills.  Please follow-up in 1 year routinely to see one of our nurse practitioners.  Your exam looks good, please be sure to schedule your eye examination and ask your eye doctor to send me her report, it is important that you have an updated eye examination at least once a year if you are stable, if not 6 monthly.

## 2019-08-11 LAB — HM MAMMOGRAPHY

## 2019-08-15 ENCOUNTER — Telehealth: Payer: Self-pay | Admitting: Family Medicine

## 2019-08-15 NOTE — Telephone Encounter (Signed)
Please see message and advise.  Thank you. ° °

## 2019-08-15 NOTE — Telephone Encounter (Signed)
Patient is calling and wanted to speak to someone regarding her mammogram results. CB is [phone].

## 2019-08-16 NOTE — Telephone Encounter (Signed)
mammo was normal/negative. Repeat mammo in 1 year. Letter from [facility] will be mailed to patient.

## 2019-08-16 NOTE — Telephone Encounter (Signed)
Pt informed

## 2019-11-03 ENCOUNTER — Other Ambulatory Visit: Payer: Self-pay | Admitting: Nurse Practitioner

## 2019-11-03 DIAGNOSIS — B009 Herpesviral infection, unspecified: Secondary | ICD-10-CM

## 2019-11-20 ENCOUNTER — Other Ambulatory Visit: Payer: Self-pay | Admitting: Nurse Practitioner

## 2019-11-20 DIAGNOSIS — L304 Erythema intertrigo: Secondary | ICD-10-CM

## 2019-11-20 NOTE — Telephone Encounter (Signed)
Yvonne Johnson please advise, you gave this rx for the pt back in 12/06/2018 for rash--itchiness on groin area.

## 2020-01-12 ENCOUNTER — Telehealth: Payer: Self-pay | Admitting: Nurse Practitioner

## 2020-01-12 NOTE — Telephone Encounter (Signed)
Patient is calling to schedule her pap.Left message to give the office a call back to schedule.

## 2020-08-06 ENCOUNTER — Telehealth: Payer: Self-pay | Admitting: Family Medicine

## 2020-08-06 NOTE — Telephone Encounter (Signed)
Pt called in to [facility] and spoke with [place] - pt was asking to schedule cpe with Dr. Bryan Lemma. Pt was advised PCP was Wilfred Lacy, NP. Pt requested to speak with mgr. I spoke to pt and recalled her name. Pt chart was never updated last year (see tel enc 06/27/19) to reflect Dr. Loletha Grayer as PCP. I offered to schedule appt for pt and apologized for the error. She states it should not be this difficult every time she calls for something. Pt noted she will be changing to a different practice. I again offered apologies and my contact information if she would like an appointment.

## 2020-08-11 NOTE — Progress Notes (Signed)
Virtual Visit via Telephone Note  I connected with [employer], on 08/12/2020 at 2:00 PM by telephone due to the COVID-19 pandemic and verified that I am speaking with the correct person using two identifiers.  Due to current restrictions/limitations of in-office visits due to the COVID-19 pandemic, this scheduled clinical appointment was converted to a telehealth visit.   Consent: I discussed the limitations, risks, security and privacy concerns of performing an evaluation and management service by telephone and the availability of in person appointments. I also discussed with the patient that there may be a patient responsible charge related to this service. The patient expressed understanding and agreed to proceed.   Location of Patient: Home  Location of Provider: [facility] [place] participating in Telemedicine visit: [place], NP Elmon Else, CMA  History of Present Illness: Yvonne Johnson is a 40 year-old female who presents to establish care. PMH significant for colon polyp, pseudotumor cerebri, bilateral low back pain, generalized abdominal pain, constipation, herpes simplex, and allergy to alpha-gal.  Current issues and/or concerns: Visit 06/28/2019 at [facility] per MD note: Interim History: [name] is a 41 year old right-handed woman with an underlying medical history of obesity who presents for follow-up consultation of her pseudotumor cerebri. The patient is unaccompanied today and presents after nearly 3 years. I last saw her on 07/22/2016, at which time she reported feeling much better after restarting her Diamox.  She ended up needing a blood patch for postop a headache but other than that she has done well.  She had seen [facility] recently and reported that her eye exam was much better.  She was advised to continue with Diamox.   Today, 06/28/2019: She reports that she  has been continuing with her Diamox through her primary care physician who refilled her prescriptions.  She reports that her new primary care physician would like for her to get her Diamox prescription through neurology.  Her last eye examination was in Jan or Feb 2020 and was stable at the time, she does get new prescription eyeglasses every year but her prescription typically does not change.  She is due for an eye examination and intends to schedule with [name].  She has had no new symptoms, tolerates the Diamox, no side effects, in particular, she has had no flareup in her headaches and no blurry vision.  She has not had any other medication changes.   The patient's allergies, current medications, family history, past medical history, past social history, past surgical history and problem list were reviewed and updated as appropriate.    Previously (copied from previous notes for reference):    I first met her on 04/23/2016 at the request of her optometrist, at which time she reported a prior diagnosis of pseudotumor cerebri, and she was found to have bilateral papilledema recently. She was originally diagnosed with pseudotumor cerebri when she was 40 years old. She had also experienced some weight gain with time. I suggested she restart taking Diamox. She called in the interim, requesting a change in her Diamox prescription for better tolerance. During her first visit, I suggested we proceed with MRI brain and spinal tap. She had a brain MRI with and without contrast on 05/06/2016 which I reviewed:IMPRESSION:  This MRI of the brain without contrast shows the following: 1.    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 Although these can be  incidental findings, the combination is most consistent with elevated intracranial hypertension as would be seen with pseudotumor cerebri. 2.    Minimal mucoperiosteal thickening of the right maxillary sinus and ethmoid air cells  consistent with minimal chronic sinusitis. 3.    There are no acute findings.    There is a normal enhancement pattern.   We called her with her test results.   She had a fluoroscopic-guided lumbar puncture on 05/08/2016: IMPRESSION: 1. Technically successful lumbar puncture under fluoroscopy. 2. Elevated opening pressure, 36 cm H2O.   We called her with her test results.   She called in the interim on 05/15/2016 for a five-day history of post LP headache. She talk to the on-call doctor at the time and was advised to proceed with a blood patch.     04/23/2016: She presented to your eye care center on 04/18/2016 with blurry vision and headaches. She had gone to urgent care recently with a sinus infection. She has a personal history of pseudotumor cerebri and a family history of pseudotumor as well. You noted slightly blurred disc margins right eye greater than left, lenses unremarkable to both eyes, no retinal problems, and corrected vision of 20/20 bilaterally. She reports a prior diagnosis of pseudotumor cerebri when she was 40 years old. She was incidentally found to have papilledema at the time of her original Dx. she was very athletic at this time, denies any headaches at the time. This time around, about 4 weeks ago she woke up with severe headache, since then she has had intermittent right-sided. Orbital and retro-orbital headaches. She snores some but intermittently and not loudly and her husband has not noticed any pauses in her breathing while she is asleep. She tries to rest enough, she has no significant recent increase in stressors, no recent medication changes, weight has been stable for the most part. She works full-time at [employer]. She has 4 children which includes a 86 year old son, 39 year old twin boys and an 59-year-old daughter. When she was first diagnosed with pseudotumor at age 24 she took Diamox, she took it off and on for years, has not taken it in the past 5-7 years. She had in  between her pregnancies some flareup of symptoms and took her Diamox at the time. She had workup and treatment previously at [facility]. Prior test results and records are not available for my review today. She reports that she had MRI testing at the time and a lumbar puncture which showed an abnormal pressure. She used to see ophthalmology in the past. Her headaches are new to her. She did not have any headaches before, she has no personal history of migraines. Her older sister also has pseudotumor cerebri. She has been taking ibuprofen on a nearly daily basis. She smokes 4 cigarettes per day on average, she drinks alcohol occasionally about twice per month and drinks one cup of coffee and one soda per day. She has not noticed any one-sided weakness, numbness, slurring of speech or droopy face.  Assessment and Plan:  In summary, Yvonne Johnson is a very pleasant 40 year old female with a history of obesity and a prior diagnosis of pseudotumor cerebri at age 89, who presents for follow-up consultation of her pseudotumor cerebri, after nearly 3 years.  She has been on Diamox 500 mg capsule SR twice daily through her primary care physician and have been doing well.  Her last eye examination per her report was in January of February of last year with  [name].  She is due for an eye exam.  She is encouraged to get checked out at least on a yearly basis as it is important to track healthy eye examination for pseudotumor cerebri.  She has had headaches in the past and blurry vision.  A spinal tap in March 2018 showed an elevated opening pressure and after that she was started on Diamox 500 mg twice daily with good tolerance and good results.  We had to switch her from the tablets to the capsules and she had done well over the years. She did have an empty sella phenomenon on the brain MRI, a finding that can be seen in patients with pseudotumor, not a definitive diagnostic sign but supportive. Otherwise her  brain MRI was normal in the past. Her exam looks good, I'm pleased to see that she has been doing well. I suggested a 1 year checkup with one of her nurse practitioners. I renewed her Diamox prescription at this time. I answered all her questions today and she was in agreement.   08/12/2020: Reports diagnosed with pseudotumor cerebri in 1999. Was managed by primary care provider at an office located in [place] for years. Decided to move care closer to home in [place]. Reports the new primary care provider would not refill Diamox and referred patient to Neurology.   Reports told by Neurology that primary care can manage care because she is stable and that she should return to Neurology only if symptoms worsen.   Reports she is frustrated that primary care is unable to manage care and would like advice about the next steps so that she does not have to return to Neurology. Reports concern that appointments with Neurology are usually booked far out and that she is overall stable and would like primary care to manage. Reports currently has enough refills available.     Past Medical History:  Diagnosis Date   Family history of breast cancer    Family history of colon cancer    Family history of colonic polyps    Family history of ovarian cancer    Idiopathic intracranial hypertension    Pseudotumor cerebri    Allergies  Allergen Reactions   Penicillins Hives   Promethazine Nausea And Vomiting, Other (See Comments) and Palpitations    Current Outpatient Medications on File Prior to Visit  Medication Sig Dispense Refill   acetaZOLAMIDE (DIAMOX) 500 MG capsule Take 1 capsule (500 mg total) by mouth 2 (two) times daily. 180 capsule 3   acyclovir (ZOVIRAX) 400 MG tablet Take 400 mg by mouth 5 (five) times daily.     Current Facility-Administered Medications on File Prior to Visit  Medication Dose Route Frequency Provider Last Rate Last Admin   gadopentetate dimeglumine (MAGNEVIST)  injection 20 mL  20 mL Intravenous Once PRN Star Age, MD        Observations/Objective: Alert and oriented x 3. Not in acute distress. Physical examination not completed as this is a telemedicine visit.  Assessment and Plan: 1. Encounter to establish care: - Patient presents today to establish care.  - Return for annual physical examination, labs, and health maintenance. Arrive fasting meaning having no food for at least 8 hours prior to appointment. You may have only water or black coffee. Please take scheduled medications as normal.  2. Pseudotumor cerebri: - Currently taking Acetazolamide (Diamox) capsules for pharmacological management. - Counseled patient we will need official documentation of medical clearance from Neurology if patient would like primary care  to manage. Patient agreeable.  - Counseled that if primary care is to manage care that she will be referred back to Neurology if she becomes unstable, new symptoms develop, or if adjustments in medication management are needed including but not limited to increasing, decreasing, or changing frequency of medication. Patient verbalized understanding.  - Counseled to keep all Ophthalmology appointments as scheduled.   Follow Up Instructions: Return for annual physical exam.   Patient was given clear instructions to go to Emergency Department or return to medical center if symptoms don't improve, worsen, or new problems develop.The patient verbalized understanding.  I discussed the assessment and treatment plan with the patient. The patient was provided an opportunity to ask questions and all were answered. The patient agreed with the plan and demonstrated an understanding of the instructions.   The patient was advised to call back or seek an in-person evaluation if the symptoms worsen or if the condition fails to improve as anticipated.    I provided 10 minutes total of non-face-to-face time during this encounter.   Camillia Herter, NP  [facility] [place], [place] 08/12/2020, 2:00 PM

## 2020-08-12 ENCOUNTER — Telehealth: Payer: Self-pay | Admitting: Neurology

## 2020-08-12 ENCOUNTER — Telehealth (INDEPENDENT_AMBULATORY_CARE_PROVIDER_SITE_OTHER): Payer: BC Managed Care – PPO | Admitting: Family

## 2020-08-12 ENCOUNTER — Encounter: Payer: Self-pay | Admitting: Family

## 2020-08-12 ENCOUNTER — Other Ambulatory Visit: Payer: Self-pay

## 2020-08-12 DIAGNOSIS — G932 Benign intracranial hypertension: Secondary | ICD-10-CM

## 2020-08-12 DIAGNOSIS — Z7689 Persons encountering health services in other specified circumstances: Secondary | ICD-10-CM

## 2020-08-12 LAB — HM MAMMOGRAPHY

## 2020-08-12 NOTE — Telephone Encounter (Signed)
Pt called, stating is it ok for my PCP to manage my condition and prescribe medication, as long as remain stable? Would like a call from the nurse.

## 2020-08-12 NOTE — Telephone Encounter (Signed)
She is overdue for an exam and I would recommend that she schedule a follow-up with one of our nurse practitioners.  We typically recommend at least a yearly checkup through our office even if prescriptions are maintained through primary care as it is important to follow patients with pseudotumor cerebri.  We also want to make sure that she has an updated eye exam at least once a year so long as she is stable and her vision is not affected, particularly her visual field. Please call patient to advise.

## 2020-08-12 NOTE — Progress Notes (Signed)
Pt presents for virtual telemedicine visit to establish care and medication refill of Diamox.

## 2020-08-13 NOTE — Telephone Encounter (Signed)
Pt advised of this information via my chart message. See message from 08/12/20, pt sts she will call in an schedule f/u visit.

## 2020-08-16 ENCOUNTER — Encounter: Payer: Self-pay | Admitting: Family Medicine

## 2020-09-15 NOTE — Progress Notes (Signed)
Patient ID: SRAVYA GRISSOM, female    DOB: 09/28/1980  MRN: [id number]  CC: Annual Physical Exam  Subjective: Yvonne Johnson is a 40 y.o. female who presents for annual physical exam.   Her concerns today include: none.  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   Genetic testing 01/09/2019   Family history of breast cancer    Family history of ovarian cancer    Family history of colon cancer    Family history of colonic polyps    Colon polyp 12/03/2017   Class 1 obesity due to excess calories without serious comorbidity with body mass index (BMI) of 32.0 to 32.9 in adult 12/25/2016   Herpes simplex 12/25/2016   CN (constipation) 04/17/2014   Generalized abdominal pain 04/17/2014   Abdominal bloating 04/17/2014   Nipple discharge 07/10/2013   Bilateral low back pain 07/10/2013   Abdominal pain, left lower quadrant 07/10/2013   Weight loss, unintentional 07/10/2013   Pseudotumor cerebri      Current Outpatient Medications on File Prior to Visit  Medication Sig Dispense Refill   acetaZOLAMIDE (DIAMOX) 500 MG capsule Take 1 capsule (500 mg total) by mouth 2 (two) times daily. 180 capsule 3   acyclovir (ZOVIRAX) 400 MG tablet Take 400 mg by mouth 5 (five) times daily.     Current Facility-Administered Medications on File Prior to Visit  Medication Dose Route Frequency Provider Last Rate Last Admin   gadopentetate dimeglumine (MAGNEVIST) injection 20 mL  20 mL Intravenous Once PRN Star Age, MD        Allergies  Allergen Reactions   Penicillins Hives   Promethazine Nausea And Vomiting, Other (See Comments) and Palpitations    Social History   Socioeconomic History   Marital status: Married    Spouse name: Not on file   Number of children: 4   Years of education: Not on file   Highest education level: Not on file  Occupational History   Occupation: MBA [employer]  Tobacco Use    Smoking status: Former    Types: Cigarettes    Quit date: 03/05/2003    Years since quitting: 17.5    Passive exposure: Current   Smokeless tobacco: Never  Vaping Use   Vaping Use: Never used  Substance and Sexual Activity   Alcohol use: Yes    Alcohol/week: 1.0 standard drink    Types: 1 Glasses of wine per week    Comment: social    Drug use: No   Sexual activity: Yes    Birth control/protection: None  Other Topics Concern   Not on file  Social History Narrative   Regular Exercise -  YES         Social Determinants of Health   Financial Resource Strain: Not on file  Food Insecurity: Not on file  Transportation Needs: Not on file  Physical Activity: Not on file  Stress: Not on file  Social Connections: Not on file  Intimate Partner Violence: Not on file    Family History  Problem Relation Age of Onset   Asthma Daughter    Asthma Son    Hypertension Maternal Aunt    Ovarian cancer Maternal Aunt 50   Breast cancer Maternal Aunt 60   Hypothyroidism Maternal Aunt 45       thyroid nodules   Hypertension Paternal Aunt    Breast cancer Paternal Aunt 29   Hypertension Mother  Hypothyroidism Mother 81       thyroid nodules   Arthritis Mother    Hypertension Father    Colon polyps Father    Diabetes Father    Hyperlipidemia Father    Hypertension Sister    Hypothyroidism Sister 4       thyroid nodules   Asthma Sister    Hypertension Maternal Grandmother    Miscarriages / Stillbirths Maternal Grandmother    Hypertension Maternal Grandfather    Liver disease Maternal Grandfather        liver cancer secondary to ETOH   Alcohol abuse Maternal Grandfather    Diabetes Paternal Grandmother    Hypertension Paternal Grandmother    Colon cancer Paternal Grandmother 15   Heart attack Paternal Grandfather    Hypertension Paternal Grandfather    Heart disease Paternal Grandfather 17       MI/CAD   Kidney disease Paternal Aunt        secondary to sarcoidosis     Past Surgical History:  Procedure Laterality Date   CESAREAN SECTION     x 3    ROS: Review of Systems Negative except as stated above  PHYSICAL EXAM: BP 108/77 (BP Location: Left Arm, Patient Position: Sitting, Cuff Size: Normal)   Pulse 73   Temp 98.1 F (36.7 C)   Resp 15   Ht 5' 6.73" (1.695 m)   Wt 207 lb (93.9 kg)   SpO2 98%   BMI 32.68 kg/m   Physical Exam HENT:     Head: Normocephalic and atraumatic.     Right Ear: Tympanic membrane, ear canal and external ear normal.     Left Ear: Tympanic membrane, ear canal and external ear normal.     Nose: Nose normal.     Mouth/Throat:     Mouth: Mucous membranes are moist.     Pharynx: Oropharynx is clear.  Eyes:     Extraocular Movements: Extraocular movements intact.     Conjunctiva/sclera: Conjunctivae normal.     Pupils: Pupils are equal, round, and reactive to light.  Cardiovascular:     Rate and Rhythm: Normal rate and regular rhythm.     Pulses: Normal pulses.     Heart sounds: Normal heart sounds.  Chest:     Comments: Patient declined exam. Abdominal:     General: Bowel sounds are normal.     Palpations: Abdomen is soft.  Genitourinary:    Comments: Patient declined exam. Musculoskeletal:        General: Normal range of motion.     Cervical back: Normal range of motion and neck supple.  Skin:    General: Skin is warm and dry.     Capillary Refill: Capillary refill takes less than 2 seconds.  Neurological:     General: No focal deficit present.     Mental Status: She is alert and oriented to person, place, and time.  Psychiatric:        Mood and Affect: Mood normal.        Behavior: Behavior normal.   ASSESSMENT AND PLAN: 1. Annual physical exam: - Counseled on routine healthcare maintenance.  2. Screening for metabolic disorder: - IRJ18+ACZY to check kidney function, liver function, and electrolyte balance.  - CMP14+EGFR  3. Screening for deficiency anemia: - CBC to screen for  anemia. - CBC  4. Diabetes mellitus screening: - Hemoglobin A1c to screen for pre-diabetes/diabetes. - Hemoglobin A1c  5. Screening cholesterol level: - Lipid panel to screen for high cholesterol.  -  Lipid panel  6. Thyroid disorder screen: - TSH to check thyroid function.  - TSH  7. Need for hepatitis C screening test: - Hepatitis C antibody to screen for hepatitis C.  - Hepatitis C Antibody  8. Pap smear for cervical cancer screening: 9. Routine screening for STI (sexually transmitted infection): - Patient declined. Reported she is on menses.   10. Need for diphtheria-tetanus-pertussis (Tdap) vaccine: - Administered today in office. - Tdap vaccine greater than or equal to 7yo IM  11. Pseudotumor cerebri: - Patient reports she will continue management of care with Neurology.    Patient was given the opportunity to ask questions.  Patient verbalized understanding of the plan and was able to repeat key elements of the plan.   Orders Placed This Encounter  Procedures   Tdap vaccine greater than or equal to 7yo IM   Hepatitis C Antibody   CBC   Lipid panel   TSH   CMP14+EGFR   Hemoglobin A1c    Follow-up with primary provider as scheduled.   Camillia Herter, NP

## 2020-09-16 ENCOUNTER — Ambulatory Visit (INDEPENDENT_AMBULATORY_CARE_PROVIDER_SITE_OTHER): Payer: BC Managed Care – PPO | Admitting: Family

## 2020-09-16 ENCOUNTER — Other Ambulatory Visit: Payer: Self-pay

## 2020-09-16 ENCOUNTER — Encounter: Payer: Self-pay | Admitting: Family

## 2020-09-16 VITALS — BP 108/77 | HR 73 | Temp 98.1°F | Resp 15 | Ht 66.73 in | Wt 207.0 lb

## 2020-09-16 DIAGNOSIS — Z1329 Encounter for screening for other suspected endocrine disorder: Secondary | ICD-10-CM

## 2020-09-16 DIAGNOSIS — Z131 Encounter for screening for diabetes mellitus: Secondary | ICD-10-CM

## 2020-09-16 DIAGNOSIS — Z113 Encounter for screening for infections with a predominantly sexual mode of transmission: Secondary | ICD-10-CM

## 2020-09-16 DIAGNOSIS — Z13220 Encounter for screening for lipoid disorders: Secondary | ICD-10-CM

## 2020-09-16 DIAGNOSIS — Z124 Encounter for screening for malignant neoplasm of cervix: Secondary | ICD-10-CM

## 2020-09-16 DIAGNOSIS — Z0000 Encounter for general adult medical examination without abnormal findings: Secondary | ICD-10-CM

## 2020-09-16 DIAGNOSIS — Z23 Encounter for immunization: Secondary | ICD-10-CM

## 2020-09-16 DIAGNOSIS — Z130 Encounter for screening for diseases of the blood and blood-forming organs and certain disorders involving the immune mechanism: Secondary | ICD-10-CM

## 2020-09-16 DIAGNOSIS — Z1159 Encounter for screening for other viral diseases: Secondary | ICD-10-CM

## 2020-09-16 DIAGNOSIS — Z13228 Encounter for screening for other metabolic disorders: Secondary | ICD-10-CM

## 2020-09-16 DIAGNOSIS — G932 Benign intracranial hypertension: Secondary | ICD-10-CM | POA: Diagnosis not present

## 2020-09-16 NOTE — Progress Notes (Signed)
Pt presents for annual physical exam  Desires tdap vaccine

## 2020-09-17 LAB — CMP14+EGFR
ALT: 8 IU/L (ref 0–32)
AST: 14 IU/L (ref 0–40)
Albumin/Globulin Ratio: 1.2 (ref 1.2–2.2)
Albumin: 4.1 g/dL (ref 3.8–4.8)
Alkaline Phosphatase: 51 IU/L (ref 44–121)
BUN/Creatinine Ratio: 12 (ref 9–23)
BUN: 10 mg/dL (ref 6–24)
Bilirubin Total: 0.3 mg/dL (ref 0.0–1.2)
CO2: 18 mmol/L — ABNORMAL LOW (ref 20–29)
Calcium: 8.9 mg/dL (ref 8.7–10.2)
Chloride: 109 mmol/L — ABNORMAL HIGH (ref 96–106)
Creatinine, Ser: 0.82 mg/dL (ref 0.57–1.00)
Globulin, Total: 3.4 g/dL (ref 1.5–4.5)
Glucose: 76 mg/dL (ref 65–99)
Potassium: 4.1 mmol/L (ref 3.5–5.2)
Sodium: 139 mmol/L (ref 134–144)
Total Protein: 7.5 g/dL (ref 6.0–8.5)
eGFR: 92 mL/min/{1.73_m2} (ref >59)

## 2020-09-17 LAB — LIPID PANEL
Chol/HDL Ratio: 1.9 ratio (ref 0.0–4.4)
Cholesterol, Total: 164 mg/dL (ref 100–199)
HDL: 87 mg/dL (ref >39)
LDL Chol Calc (NIH): 64 mg/dL (ref 0–99)
Triglycerides: 65 mg/dL (ref 0–149)
VLDL Cholesterol Cal: 13 mg/dL (ref 5–40)

## 2020-09-17 LAB — CBC
Hematocrit: 39 % (ref 34.0–46.6)
Hemoglobin: 13.1 g/dL (ref 11.1–15.9)
MCH: 32.8 pg (ref 26.6–33.0)
MCHC: 33.6 g/dL (ref 31.5–35.7)
MCV: 98 fL — ABNORMAL HIGH (ref 79–97)
Platelets: 254 10*3/uL (ref 150–450)
RBC: 4 x10E6/uL (ref 3.77–5.28)
RDW: 13 % (ref 11.7–15.4)
WBC: 4.4 10*3/uL (ref 3.4–10.8)

## 2020-09-17 LAB — HEPATITIS C ANTIBODY: Hep C Virus Ab: 0.2 s/co ratio (ref 0.0–0.9)

## 2020-09-17 LAB — HEMOGLOBIN A1C
Est. average glucose Bld gHb Est-mCnc: 103 mg/dL
Hgb A1c MFr Bld: 5.2 % (ref 4.8–5.6)

## 2020-09-17 LAB — TSH: TSH: 0.886 u[IU]/mL (ref 0.450–4.500)

## 2020-09-17 NOTE — Progress Notes (Signed)
Kidney function normal.   Liver function normal.   Thyroid function normal.   Cholesterol normal.  No diabetes.   No anemia.   Hepatitis C negative.

## 2020-10-23 ENCOUNTER — Ambulatory Visit: Payer: BC Managed Care – PPO | Admitting: Family

## 2020-10-24 NOTE — Progress Notes (Signed)
Patient ID: Yvonne Johnson, female    DOB: 11-21-1980  MRN: [id number]  CC: PAP Smear   Subjective: Yvonne Johnson is a 40 y.o. female who presents for PAP Smear.   Her concerns today include:  Concern for right thigh bruising. Does not recall hitting the right thigh. Bruising has decreased in size since beginning but not completely resolved. Concern for possible Lyme disease as there are a lot of trees around her home also has a dog. No recent hiking.   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   Genetic testing 01/09/2019   Family history of breast cancer    Family history of ovarian cancer    Family history of colon cancer    Family history of colonic polyps    Colon polyp 12/03/2017   Class 1 obesity due to excess calories without serious comorbidity with body mass index (BMI) of 32.0 to 32.9 in adult 12/25/2016   Herpes simplex 12/25/2016   CN (constipation) 04/17/2014   Generalized abdominal pain 04/17/2014   Abdominal bloating 04/17/2014   Nipple discharge 07/10/2013   Bilateral low back pain 07/10/2013   Abdominal pain, left lower quadrant 07/10/2013   Weight loss, unintentional 07/10/2013   Pseudotumor cerebri      Current Outpatient Medications on File Prior to Visit  Medication Sig Dispense Refill   acetaZOLAMIDE (DIAMOX) 500 MG capsule Take 1 capsule (500 mg total) by mouth 2 (two) times daily. 180 capsule 3   acyclovir (ZOVIRAX) 400 MG tablet Take 400 mg by mouth 5 (five) times daily.     Current Facility-Administered Medications on File Prior to Visit  Medication Dose Route Frequency Provider Last Rate Last Admin   gadopentetate dimeglumine (MAGNEVIST) injection 20 mL  20 mL Intravenous Once PRN Star Age, MD        Allergies  Allergen Reactions   Penicillins Hives   Promethazine Nausea And Vomiting, Other (See Comments) and Palpitations    Social History    Socioeconomic History   Marital status: Married    Spouse name: Not on file   Number of children: 4   Years of education: Not on file   Highest education level: Not on file  Occupational History   Occupation: MBA [employer]  Tobacco Use   Smoking status: Former    Types: Cigarettes    Quit date: 03/05/2003    Years since quitting: 17.6    Passive exposure: Current   Smokeless tobacco: Never  Vaping Use   Vaping Use: Never used  Substance and Sexual Activity   Alcohol use: Yes    Alcohol/week: 1.0 standard drink    Types: 1 Glasses of wine per week    Comment: social    Drug use: No   Sexual activity: Yes    Birth control/protection: None  Other Topics Concern   Not on file  Social History Narrative   Regular Exercise -  YES         Social Determinants of Health   Financial Resource Strain: Not on file  Food Insecurity: Not on file  Transportation Needs: Not on file  Physical Activity: Not on file  Stress: Not on file  Social Connections: Not on file  Intimate Partner Violence: Not on file    Family History  Problem Relation Age of Onset   Asthma Daughter    Asthma Son    Hypertension Maternal Aunt  Ovarian cancer Maternal Aunt 45   Breast cancer Maternal Aunt 60   Hypothyroidism Maternal Aunt 45       thyroid nodules   Hypertension Paternal Aunt    Breast cancer Paternal Aunt 70   Hypertension Mother    Hypothyroidism Mother 42       thyroid nodules   Arthritis Mother    Hypertension Father    Colon polyps Father    Diabetes Father    Hyperlipidemia Father    Hypertension Sister    Hypothyroidism Sister 49       thyroid nodules   Asthma Sister    Hypertension Maternal Grandmother    Miscarriages / Stillbirths Maternal Grandmother    Hypertension Maternal Grandfather    Liver disease Maternal Grandfather        liver cancer secondary to ETOH   Alcohol abuse Maternal Grandfather    Diabetes Paternal Grandmother    Hypertension Paternal  Grandmother    Colon cancer Paternal Grandmother 42   Heart attack Paternal Grandfather    Hypertension Paternal Grandfather    Heart disease Paternal Grandfather 70       MI/CAD   Kidney disease Paternal Aunt        secondary to sarcoidosis    Past Surgical History:  Procedure Laterality Date   CESAREAN SECTION     x 3    ROS: Review of Systems Negative except as stated above  PHYSICAL EXAM: BP 104/74 (BP Location: Left Arm, Patient Position: Sitting, Cuff Size: Normal)   Pulse 91   Temp 97.7 F (36.5 C)   Resp 18   Ht 5' 6.73" (1.695 m)   Wt 210 lb 9.6 oz (95.5 kg)   SpO2 97%   BMI 33.25 kg/m    Physical Exam HENT:     Head: Normocephalic and atraumatic.  Eyes:     Extraocular Movements: Extraocular movements intact.     Conjunctiva/sclera: Conjunctivae normal.     Pupils: Pupils are equal, round, and reactive to light.  Cardiovascular:     Rate and Rhythm: Normal rate and regular rhythm.     Pulses: Normal pulses.     Heart sounds: Normal heart sounds.  Pulmonary:     Effort: Pulmonary effort is normal.     Breath sounds: Normal breath sounds.  Chest:     Comments: Most recent mammogram 08/12/2020. Genitourinary:    General: Normal vulva.     Vagina: Normal.     Cervix: Normal.     Uterus: Normal.      Adnexa: Right adnexa normal and left adnexa normal.     Comments: Yvonne Johnson, CMA present during exam. Musculoskeletal:     Cervical back: Normal range of motion and neck supple.  Skin:    Findings: Bruising present.     Comments: Hyperpigmented bruising with outer red ring on right anterior aspect thigh. No evidence of pain with palpation. No compromise in skin integrity.   Neurological:     General: No focal deficit present.     Mental Status: She is alert and oriented to person, place, and time.  Psychiatric:        Mood and Affect: Mood normal.        Behavior: Behavior normal.    ASSESSMENT AND PLAN: 1. Pap smear for cervical cancer  screening: - Cytology - PAP for cervical cancer screening.  - Cytology - PAP([place])  2. Routine screening for STI (sexually transmitted infection): - Cervicovaginal self-swab to screen for  chlamydia, gonorrhea, trichomonas, bacterial vaginitis, and candida vaginitis. - Cervicovaginal ancillary only  3. Bruising: - Exam appears most consistent with incidental bruising. Do not suspect Lyme Disease as presentation of has improved since onset.  - Lyme Disease Serology for further evaluation.  - Lyme Disease Serology w/Reflex   Patient was given the opportunity to ask questions.  Patient verbalized understanding of the plan and was able to repeat key elements of the plan. Patient was given clear instructions to go to Emergency Department or return to medical center if symptoms don't improve, worsen, or new problems develop.The patient verbalized understanding.   Orders Placed This Encounter  Procedures   Lyme Disease Serology w/Reflex   Follow-up with primary provider as scheduled.  Camillia Herter, NP

## 2020-10-25 ENCOUNTER — Other Ambulatory Visit (HOSPITAL_COMMUNITY)
Admission: RE | Admit: 2020-10-25 | Discharge: 2020-10-25 | Disposition: A | Discharge status: Home / Self Care | Payer: BC Managed Care – PPO | Source: Ambulatory Visit | Attending: Family | Admitting: Family

## 2020-10-25 ENCOUNTER — Other Ambulatory Visit: Payer: Self-pay

## 2020-10-25 ENCOUNTER — Ambulatory Visit (INDEPENDENT_AMBULATORY_CARE_PROVIDER_SITE_OTHER): Payer: BC Managed Care – PPO | Admitting: Family

## 2020-10-25 VITALS — BP 104/74 | HR 91 | Temp 97.7°F | Resp 18 | Ht 66.73 in | Wt 210.6 lb

## 2020-10-25 DIAGNOSIS — Z124 Encounter for screening for malignant neoplasm of cervix: Secondary | ICD-10-CM

## 2020-10-25 DIAGNOSIS — T148XXA Other injury of unspecified body region, initial encounter: Secondary | ICD-10-CM

## 2020-10-25 DIAGNOSIS — Z113 Encounter for screening for infections with a predominantly sexual mode of transmission: Secondary | ICD-10-CM

## 2020-10-25 NOTE — Patient Instructions (Signed)
Pap Test Why am I having this test? A Pap test, also called a Pap smear, is a screening test to check for signs of: Cancer of the vagina, cervix, and uterus. The cervix is the lower part of the uterus that opens into the vagina. Infection. Changes that may be a sign that cancer is developing (precancerous changes). Women need this test on a regular basis. In general, you should have a Pap test every 3 years until you reach menopause or age 42. Women aged 30-60 may choose to have their Pap test done at the same time as an HPV (human papillomavirus) test every 5 years (instead of every 3 years). Your health care provider may recommend having Pap tests more or less oftendepending on your medical conditions and past Pap test results. What kind of sample is taken?  Your health care provider will collect a sample of cells from the surface of your cervix. This will be done using a small cotton swab, plastic spatula, or brush. This sample is often collected during a pelvic exam, when you are lying on your back on an exam table with feet in footrests (stirrups). In some cases, fluids (secretions) from the cervix or vagina may also be collected. How do I prepare for this test? Be aware of where you are in your menstrual cycle. If you are menstruating on the day of the test, you may be asked to reschedule. You may need to reschedule if you have a known vaginal infection on the day of the test. Follow instructions from your health care provider about: Changing or stopping your regular medicines. Some medicines can cause abnormal test results, such as digitalis and tetracycline. Avoiding douching or taking a bath the day before or the day of the test. Tell a health care provider about: Any allergies you have. All medicines you are taking, including vitamins, herbs, eye drops, creams, and over-the-counter medicines. Any blood disorders you have. Any surgeries you have had. Any medical conditions you  have. Whether you are pregnant or may be pregnant. How are the results reported? Your test results will be reported as either abnormal or normal. A false-positive result can occur. A false positive is incorrect because itmeans that a condition is present when it is not. A false-negative result can occur. A false negative is incorrect because itmeans that a condition is not present when it is. What do the results mean? A normal test result means that you do not have signs of cancer of the vagina,cervix, or uterus. An abnormal result may mean that you have: Cancer. A Pap test by itself is not enough to diagnose cancer. You will have more tests done in this case. Precancerous changes in your vagina, cervix, or uterus. Inflammation of the cervix. An STD (sexually transmitted disease). A fungal infection. A parasite infection. Talk with your health care provider about what your results mean. Questions to ask your health care provider Ask your health care provider, or the department that is doing the test: When will my results be ready? How will I get my results? What are my treatment options? What other tests do I need? What are my next steps? Summary In general, women should have a Pap test every 3 years until they reach menopause or age 40. Your health care provider will collect a sample of cells from the surface of your cervix. This will be done using a small cotton swab, plastic spatula, or brush. In some cases, fluids (secretions) from the cervix or  vagina may also be collected. This information is not intended to replace advice given to you by your health care provider. Make sure you discuss any questions you have with your healthcare provider. Document Revised: 01/30/2020 Document Reviewed: 10/20/2019 Elsevier Patient Education  [place].

## 2020-10-25 NOTE — Progress Notes (Signed)
Pt presents for gynecologic exam desires STD testing

## 2020-10-28 LAB — CERVICOVAGINAL ANCILLARY ONLY
Bacterial Vaginitis (gardnerella): NEGATIVE
Candida Glabrata: NEGATIVE
Candida Vaginitis: NEGATIVE
Chlamydia: NEGATIVE
Comment: NEGATIVE
Comment: NEGATIVE
Comment: NEGATIVE
Comment: NEGATIVE
Comment: NEGATIVE
Comment: NORMAL
Neisseria Gonorrhea: NEGATIVE
Trichomonas: NEGATIVE

## 2020-10-28 LAB — LYME DISEASE SEROLOGY W/REFLEX: Lyme Total Antibody EIA: NEGATIVE

## 2020-10-28 NOTE — Progress Notes (Signed)
Lyme disease negative.

## 2020-10-29 LAB — CYTOLOGY - PAP
Adequacy: ABSENT
Comment: NEGATIVE
Diagnosis: NEGATIVE
High risk HPV: NEGATIVE

## 2020-10-29 NOTE — Progress Notes (Signed)
Gonorrhea, Chlamydia, Trichomonas, Bacterial Vaginitis, and Candida Vaginitis (sometimes called yeast infection) negative.

## 2020-10-29 NOTE — Progress Notes (Signed)
HPV negative.   PAP no lesion or malignancy.   Repeat PAP in 3 years or sooner if needed.

## 2021-01-09 NOTE — Progress Notes (Addendum)
Patient ID: Yvonne Johnson, female    DOB: 03/04/1980  MRN: [id number]  CC: Breast Tenderness  Subjective: Yvonne Johnson is a 40 y.o. female who presents for breast tenderness.   Her concerns today include:   BREAST LUMP: Reports bilateral breasts usually sore during menses and then subsides. Within the last few weeks noticed a right breast lump and continued soreness of the right breast after menses ended. Denies nipple discharge, skin changes of the breast, and irregular menses. Taking over-the-counter Tylenol which helps with discomfort. Not taking any hormone contraception. Wearing an underwire bra for supportive measures.    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   Genetic testing 01/09/2019   Family history of breast cancer    Family history of ovarian cancer    Family history of colon cancer    Family history of colonic polyps    Colon polyp 12/03/2017   Class 1 obesity due to excess calories without serious comorbidity with body mass index (BMI) of 32.0 to 32.9 in adult 12/25/2016   Herpes simplex 12/25/2016   CN (constipation) 04/17/2014   Generalized abdominal pain 04/17/2014   Abdominal bloating 04/17/2014   Nipple discharge 07/10/2013   Bilateral low back pain 07/10/2013   Abdominal pain, left lower quadrant 07/10/2013   Weight loss, unintentional 07/10/2013   Pseudotumor cerebri      Current Outpatient Medications on File Prior to Visit  Medication Sig Dispense Refill   acetaZOLAMIDE (DIAMOX) 500 MG capsule Take 1 capsule (500 mg total) by mouth 2 (two) times daily. 180 capsule 3   acyclovir (ZOVIRAX) 400 MG tablet Take 400 mg by mouth 5 (five) times daily.     Current Facility-Administered Medications on File Prior to Visit  Medication Dose Route Frequency Provider Last Rate Last Admin   gadopentetate dimeglumine (MAGNEVIST) injection 20 mL  20 mL Intravenous Once PRN  Star Age, MD        Allergies  Allergen Reactions   Penicillins Hives   Promethazine Nausea And Vomiting, Other (See Comments) and Palpitations    Social History   Socioeconomic History   Marital status: Married    Spouse name: Not on file   Number of children: 4   Years of education: Not on file   Highest education level: Not on file  Occupational History   Occupation: MBA [employer]  Tobacco Use   Smoking status: Former    Types: Cigarettes    Quit date: 03/05/2003    Years since quitting: 17.8    Passive exposure: Current   Smokeless tobacco: Never  Vaping Use   Vaping Use: Never used  Substance and Sexual Activity   Alcohol use: Yes    Alcohol/week: 1.0 standard drink    Types: 1 Glasses of wine per week    Comment: social    Drug use: No   Sexual activity: Yes    Birth control/protection: None  Other Topics Concern   Not on file  Social History Narrative   Regular Exercise -  YES         Social Determinants of Health   Financial Resource Strain: Not on file  Food Insecurity: Not on file  Transportation Needs: Not on file  Physical Activity: Not on file  Stress: Not on file  Social Connections: Not on file  Intimate Partner Violence: Not on file    Family History  Problem Relation Age  of Onset   Asthma Daughter    Asthma Son    Hypertension Maternal Aunt    Ovarian cancer Maternal Aunt 50   Breast cancer Maternal Aunt 60   Hypothyroidism Maternal Aunt 45       thyroid nodules   Hypertension Paternal Aunt    Breast cancer Paternal Aunt 53   Hypertension Mother    Hypothyroidism Mother 18       thyroid nodules   Arthritis Mother    Hypertension Father    Colon polyps Father    Diabetes Father    Hyperlipidemia Father    Hypertension Sister    Hypothyroidism Sister 58       thyroid nodules   Asthma Sister    Hypertension Maternal Grandmother    Miscarriages / Stillbirths Maternal Grandmother    Hypertension Maternal Grandfather     Liver disease Maternal Grandfather        liver cancer secondary to ETOH   Alcohol abuse Maternal Grandfather    Diabetes Paternal Grandmother    Hypertension Paternal Grandmother    Colon cancer Paternal Grandmother 73   Heart attack Paternal Grandfather    Hypertension Paternal Grandfather    Heart disease Paternal Grandfather 72       MI/CAD   Kidney disease Paternal Aunt        secondary to sarcoidosis    Past Surgical History:  Procedure Laterality Date   CESAREAN SECTION     x 3    ROS: Review of Systems Negative except as stated above  PHYSICAL EXAM: BP 114/74 (BP Location: Left Arm, Patient Position: Sitting, Cuff Size: Large)   Pulse 81   Temp 98.3 F (36.8 C)   Resp 18   Ht 5' 6.73" (1.695 m)   Wt 212 lb 6.4 oz (96.3 kg)   SpO2 98%   BMI 33.53 kg/m   Physical Exam Exam conducted with a chaperone present.  HENT:     Head: Normocephalic and atraumatic.  Eyes:     Extraocular Movements: Extraocular movements intact.     Conjunctiva/sclera: Conjunctivae normal.     Pupils: Pupils are equal, round, and reactive to light.  Cardiovascular:     Rate and Rhythm: Normal rate and regular rhythm.     Pulses: Normal pulses.     Heart sounds: Normal heart sounds.  Pulmonary:     Effort: Pulmonary effort is normal.     Breath sounds: Normal breath sounds.  Chest:  Breasts:    Right: Mass present.     Left: Normal.     Comments: Right breast with firm movable mass located at right breast upper outer quadrant. No evidence of erythema, tenderness or pain with palpation, warmth, compromised skin integrity, or nipple discharge. Elmon Else, CMA present during exam. Musculoskeletal:     Cervical back: Normal range of motion and neck supple.  Skin:    General: Skin is warm and dry.  Neurological:     General: No focal deficit present.     Mental Status: She is alert and oriented to person, place, and time.  Psychiatric:        Mood and Affect: Mood normal.         Behavior: Behavior normal.   ASSESSMENT AND PLAN: 1. Mass of upper outer quadrant of right breast: - Ultrasound right breast for further evaluation. Awaiting fax from [facility] so that we can get patient scheduled as soon as possible. - Most recent mammogram 08/12/2020 and normal  at that time with recommendation to repeat in 1 year. - Continue over-the-counter Acetaminophen and Ibuprofen for pain management. - Follow-up with primary provider as scheduled. - US BREAST COMPLETE UNI RIGHT INC AXILLA; Future    Patient was given the opportunity to ask questions.  Patient verbalized understanding of the plan and was able to repeat key elements of the plan. Patient was given clear instructions to go to Emergency Department or return to medical center if symptoms don't improve, worsen, or new problems develop.The patient verbalized understanding.   Orders Placed This Encounter  Procedures   US BREAST COMPLETE UNI RIGHT INC AXILLA    Follow-up with primary provider as scheduled.   Camillia Herter, NP

## 2021-01-10 ENCOUNTER — Ambulatory Visit (INDEPENDENT_AMBULATORY_CARE_PROVIDER_SITE_OTHER): Payer: BC Managed Care – PPO | Admitting: Family

## 2021-01-10 ENCOUNTER — Other Ambulatory Visit: Payer: Self-pay | Admitting: Family

## 2021-01-10 ENCOUNTER — Other Ambulatory Visit: Payer: Self-pay

## 2021-01-10 VITALS — BP 114/74 | HR 81 | Temp 98.3°F | Resp 18 | Ht 66.73 in | Wt 212.4 lb

## 2021-01-10 DIAGNOSIS — N6311 Unspecified lump in the right breast, upper outer quadrant: Secondary | ICD-10-CM | POA: Diagnosis not present

## 2021-01-10 NOTE — Progress Notes (Signed)
Pt presents for bilateral breast pain for 2 months, pt complains of lump on right side

## 2021-01-10 NOTE — Patient Instructions (Signed)
Right breast ultrasound ordered.  Follow-up with primary care provider as needed.  Breast Tenderness Breast tenderness is a common problem for women of all ages, but may also occur in men. Breast tenderness may range from mild discomfort to severe pain. In women, the pain usually comes and goes with the menstrual cycle, but it can also be constant. Breast tenderness has many possible causes, including hormone changes, infections, and taking certain medicines. You may have tests, such as a mammogram or an ultrasound, to check for any unusual findings. Having breast tenderness usually does not mean that you have breast cancer. Follow these instructions at home: Managing pain and discomfort  If directed, put ice to the painful area. To do this: Put ice in a plastic bag. Place a towel between your skin and the bag. Leave the ice on for 20 minutes, 2-3 times a day. Wear a supportive bra, especially during exercise. You may also want to wear a supportive bra while sleeping if your breasts are very tender. Medicines Take over-the-counter and prescription medicines only as told by your health care provider. If the cause of your pain is infection, you may be prescribed an antibiotic medicine. If you were prescribed an antibiotic, take it as told by your health care provider. Do not stop taking the antibiotic even if you start to feel better. Eating and drinking Your health care provider may recommend that you lessen the amount of fat in your diet. You can do this by: Limiting fried foods. Cooking foods using methods such as baking, boiling, grilling, and broiling. Decrease the amount of caffeine in your diet. Instead, drink more water and choose caffeine-free drinks. General instructions  Keep a log of the days and times when your breasts are most tender. Ask your health care provider how to do breast exams at home. This will help you notice if you have an unusual growth or lump. Keep all follow-up  visits as told by your health care provider. This is important. Contact a health care provider if: Any part of your breast is hard, red, and hot to the touch. This may be a sign of infection. You are a woman and: Not breastfeeding and you have fluid, especially blood or pus, coming out of your nipples. Have a new or painful lump in your breast that remains after your menstrual period ends. You have a fever. Your pain does not improve or it gets worse. Your pain is interfering with your daily activities. Summary Breast tenderness may range from mild discomfort to severe pain. Breast tenderness has many possible causes, including hormone changes, infections, and taking certain medicines. It can be treated with ice, wearing a supportive bra, and medicines. Make changes to your diet if told to by your health care provider. This information is not intended to replace advice given to you by your health care provider. Make sure you discuss any questions you have with your health care provider. Document Revised: 07/11/2018 Document Reviewed: 07/11/2018 Elsevier Patient Education  [place].

## 2021-01-14 ENCOUNTER — Other Ambulatory Visit: Payer: Self-pay | Admitting: Family

## 2021-01-14 DIAGNOSIS — N6311 Unspecified lump in the right breast, upper outer quadrant: Secondary | ICD-10-CM

## 2021-01-28 ENCOUNTER — Encounter: Payer: Self-pay | Admitting: Family

## 2021-01-28 ENCOUNTER — Telehealth: Payer: Self-pay | Admitting: Family

## 2021-01-28 LAB — HM MAMMOGRAPHY

## 2021-01-28 NOTE — Telephone Encounter (Signed)
Contacted [facility]@11 :15 am spoke w/Cassandra to advise that we did not receive order for pt procedure,-order was received, signed by provider and faxed back to [facility] @11 :47 am confirmation successful..Original order from [facility] sent to Dr. Baker Janus at fax# [phone] on 11/17.

## 2021-01-28 NOTE — Telephone Encounter (Signed)
Pt called stating [facility] needs a referral for today by 10 am faxed to [phone]. Please advise and thank you

## 2021-01-28 NOTE — Telephone Encounter (Signed)
[facility] paperwork was faxed. Original order was sent to incorrect provider.

## 2021-02-28 ENCOUNTER — Other Ambulatory Visit: Payer: BC Managed Care – PPO

## 2021-05-23 ENCOUNTER — Encounter: Payer: Self-pay | Admitting: Family

## 2021-08-18 DIAGNOSIS — Z1231 Encounter for screening mammogram for malignant neoplasm of breast: Secondary | ICD-10-CM | POA: Diagnosis not present

## 2021-08-18 LAB — HM MAMMOGRAPHY

## 2021-08-28 ENCOUNTER — Encounter: Payer: Self-pay | Admitting: Family

## 2021-09-21 IMAGING — US US PELVIS COMPLETE WITH TRANSVAGINAL
1 series · 14 of 25 positions shown · non-contrast
Comparison: 07/14/2013

CLINICAL DATA: Pelvic pain and urinary urgency for 2 months

EXAM:
TRANSABDOMINAL AND TRANSVAGINAL ULTRASOUND OF PELVIS
TECHNIQUE: Both transabdominal and transvaginal ultrasound examinations of the
pelvis were performed. Transabdominal technique was performed for
global imaging of the pelvis including uterus, ovaries, adnexal
regions, and pelvic cul-de-sac. It was necessary to proceed with
endovaginal exam following the transabdominal exam to visualize the
uterus and RIGHT ovary.

[Series 1: us pelvis complete with transvaginal · 0.18mm/px · 14 of 61 slices shown]
[im 1/61]
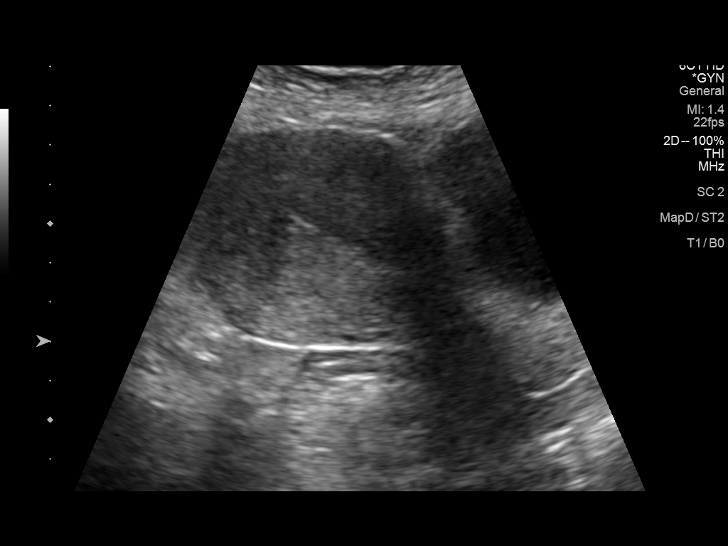
[im 6/61]
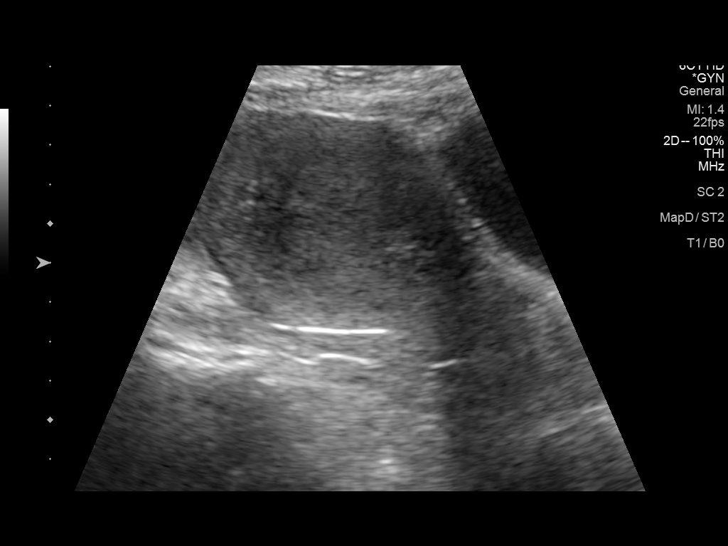
[im 11/61]
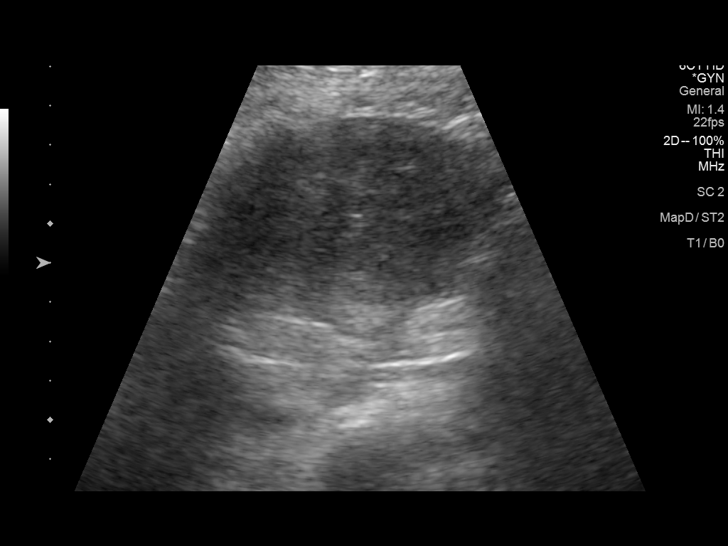
[im 16/61]
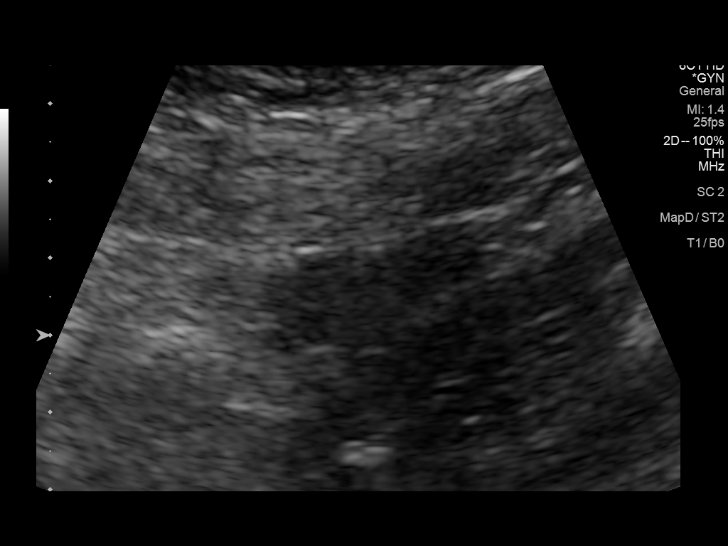
[im 21/61]
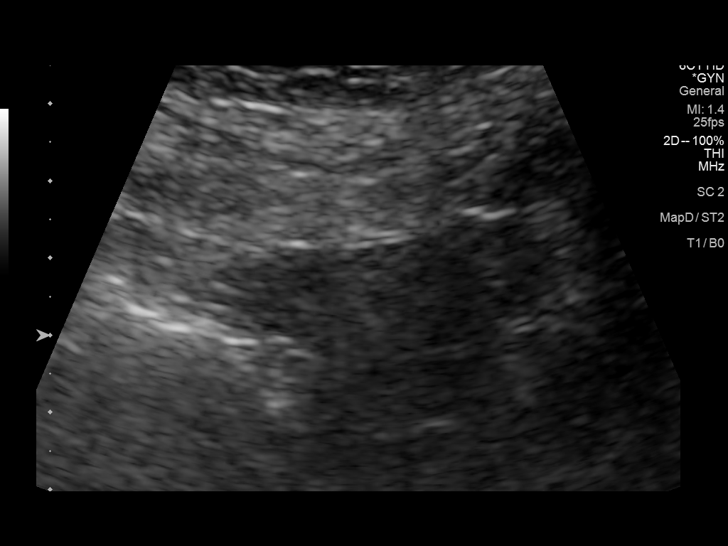
[im 23/61]
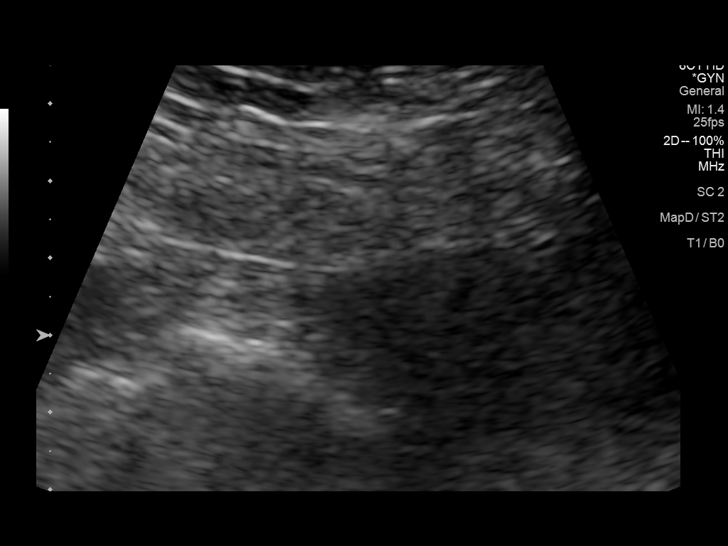
[im 28/61]
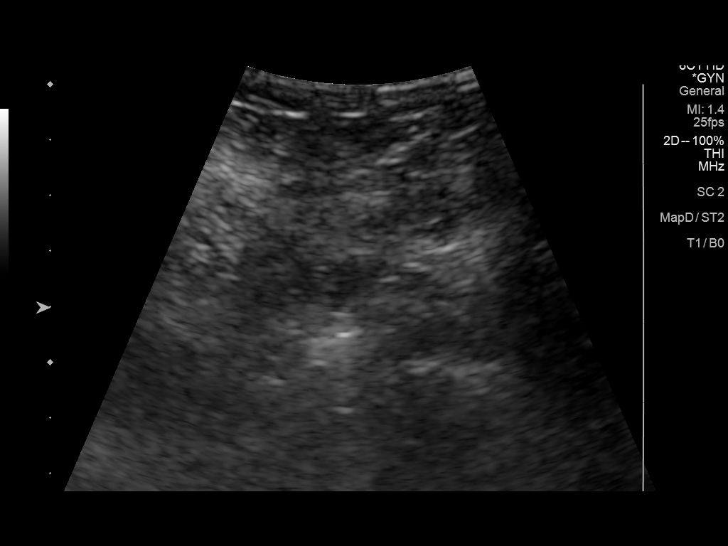
[im 33/61]
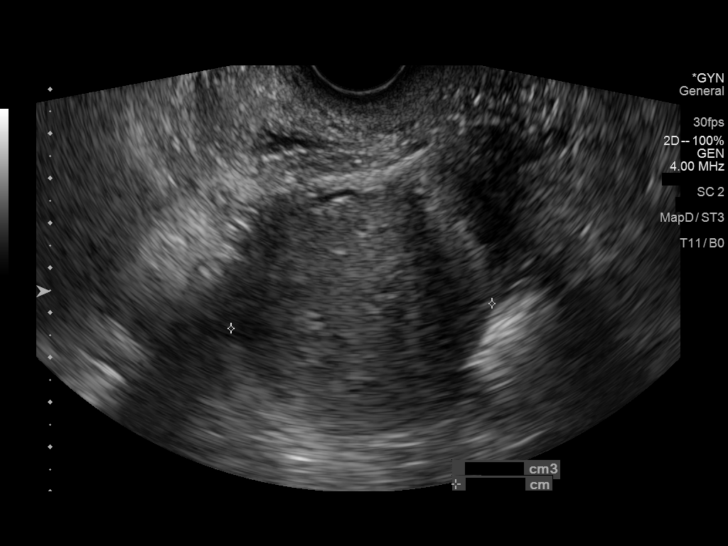
[im 38/61]
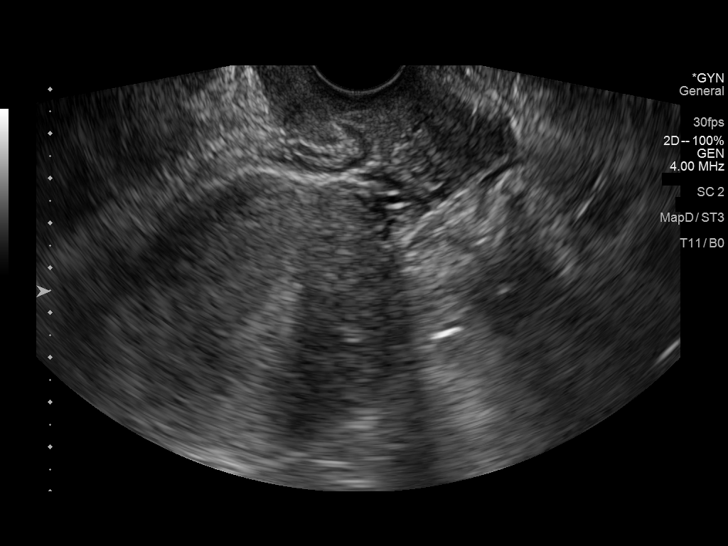
[im 41/61]
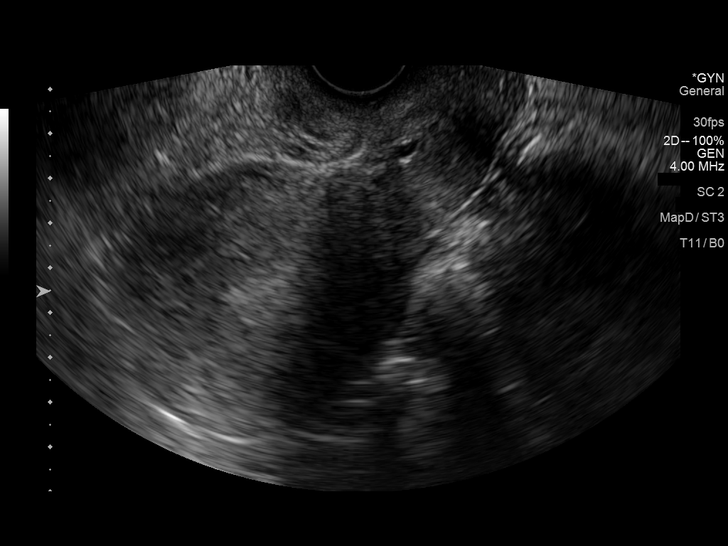
[im 46/61]
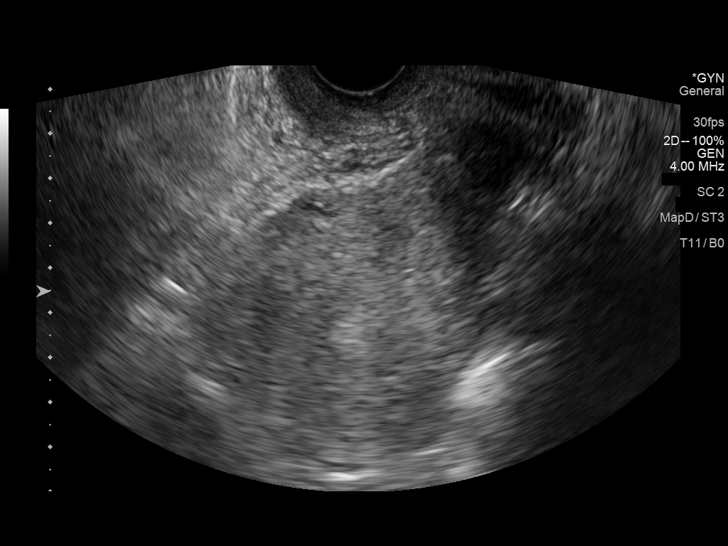
[im 51/61]
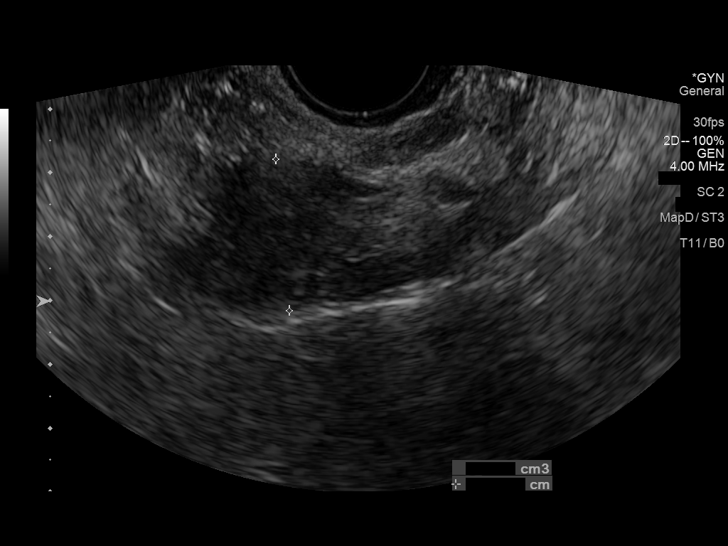
[im 56/61]
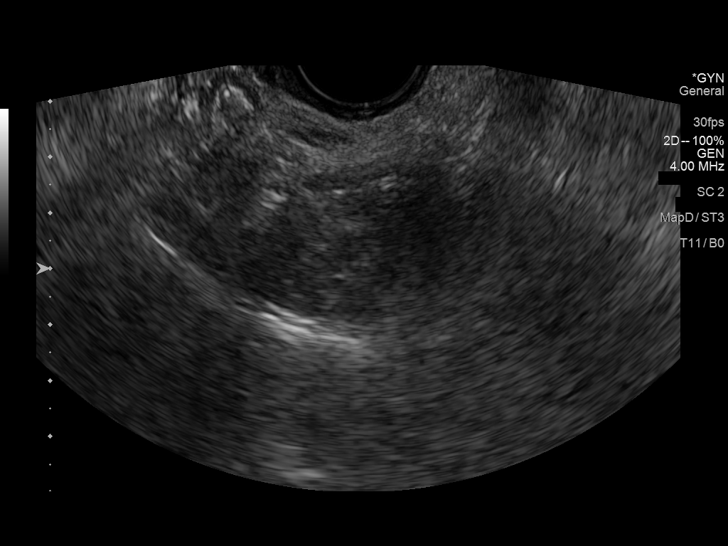
[im 61/61]
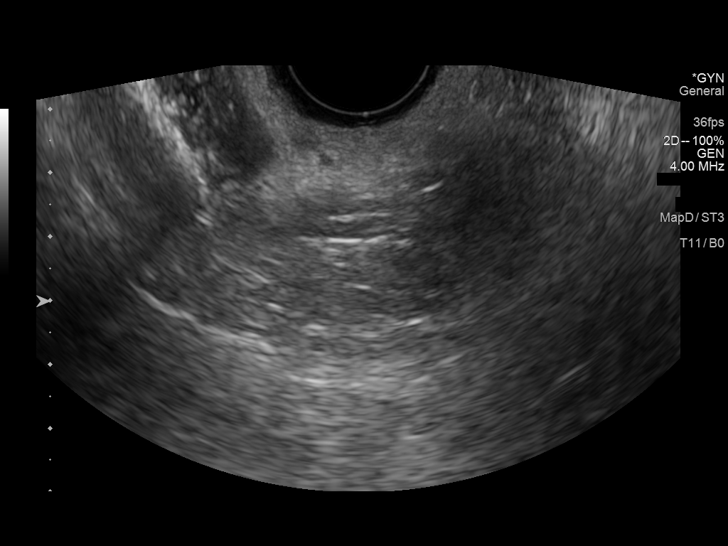

[14 of 25 positions shown; findings below may reference images not displayed]

FINDINGS: Uterus

Measurements: 11.0 x 6.1 x 6.5 cm = volume: 225 mL. Anteverted.
Normal morphology without mass

Endometrium

Thickness: 10 mm.  No endometrial fluid or focal abnormality

Right ovary

Not visualized on either transabdominal or endovaginal imaging,
likely obscured by bowel

Left ovary

Measurements: 4.1 x 2.8 x 2.4 cm = volume: 14.3 mL. Normal
morphology without mass

Other findings

No free pelvic fluid or adnexal masses.
IMPRESSION: Nonvisualization of RIGHT ovary.

Otherwise normal exam.

## 2021-10-05 IMAGING — CT CT PELVIS W/ CM
1 series · 16 of 32 positions shown, 20 images · IV contrast (APPLIED)
Comparison: 07/14/2013 ultrasound

CLINICAL DATA: Pelvic pain

EXAM:
CT PELVIS WITH CONTRAST
TECHNIQUE: Multidetector CT imaging of the pelvis was performed using the
standard protocol following the bolus administration of intravenous
contrast.
CONTRAST:  100mL [id number] IOPAMIDOL ([id number]) INJECTION 61%

[Series 2: routine pelvis w/cm · axial · 0.94mm/px · z∈[-313,-83]mm · 16 of 52 slices shown, 20 images]
[im 4/52  soft-tissue]
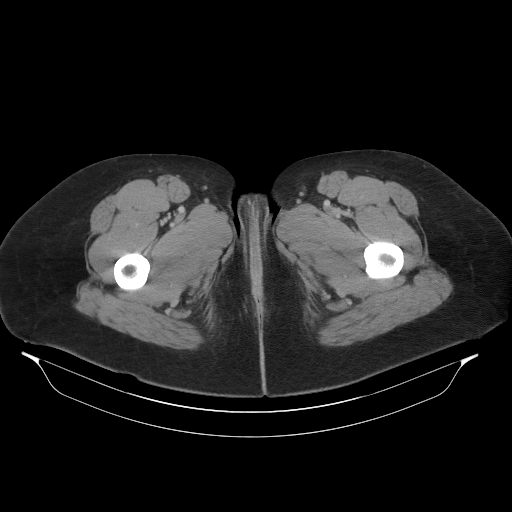
[im 4/52  bone]
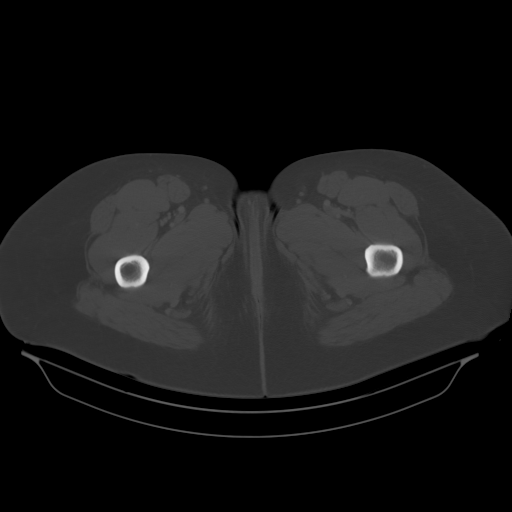
[im 7/52  soft-tissue]
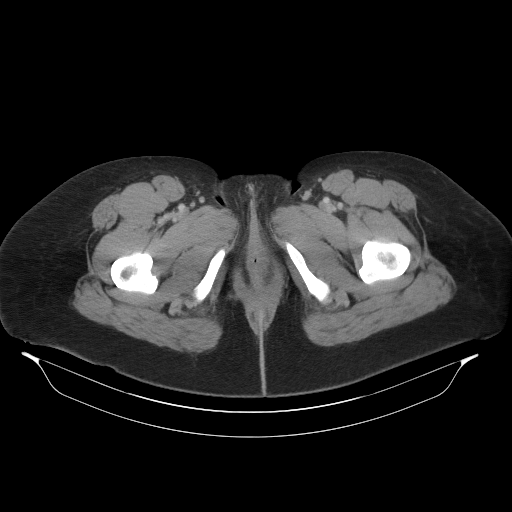
[im 10/52  soft-tissue]
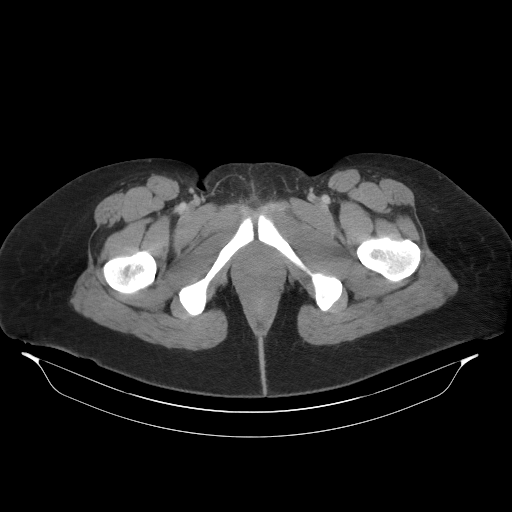
[im 14/52  soft-tissue]
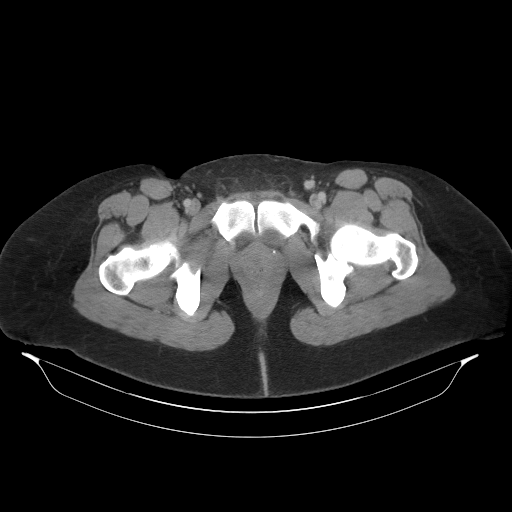
[im 17/52  soft-tissue]
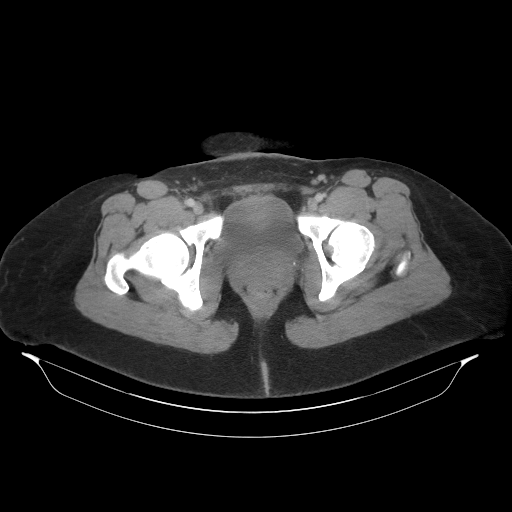
[im 20/52  soft-tissue]
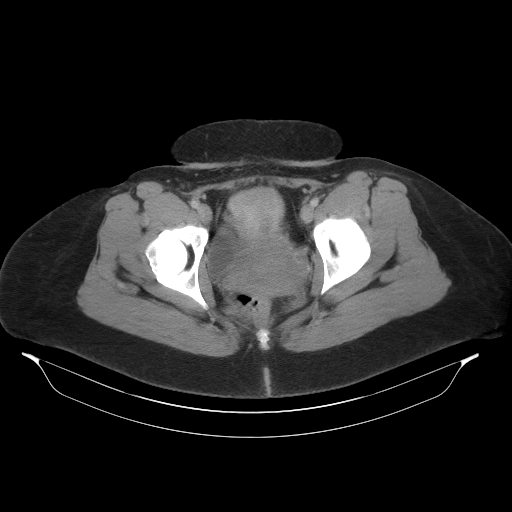
[im 24/52  soft-tissue]
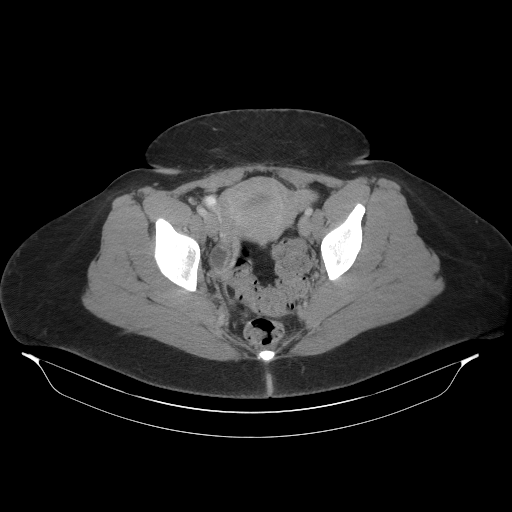
[im 28/52  soft-tissue]
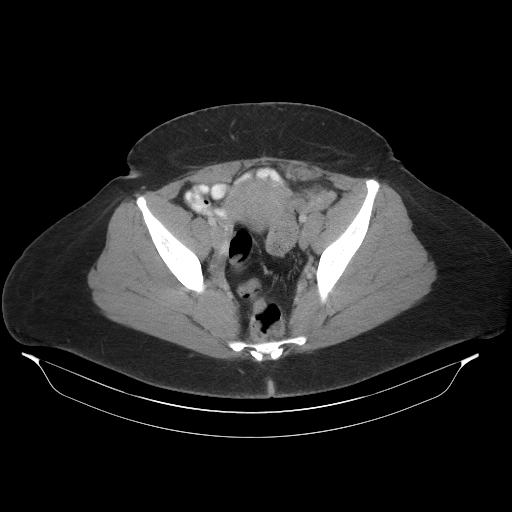
[im 32/52  soft-tissue]
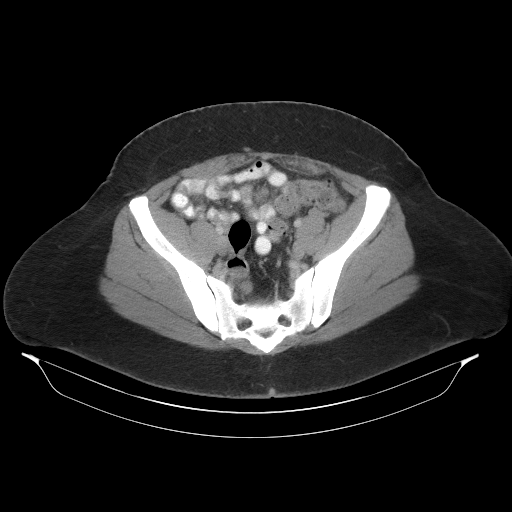
[im 32/52  bone]
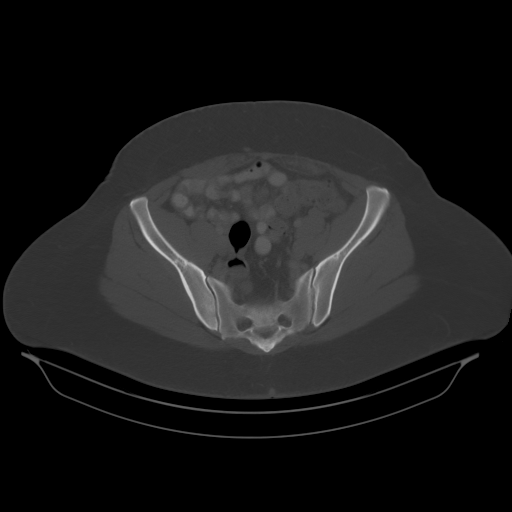
[im 35/52  soft-tissue]
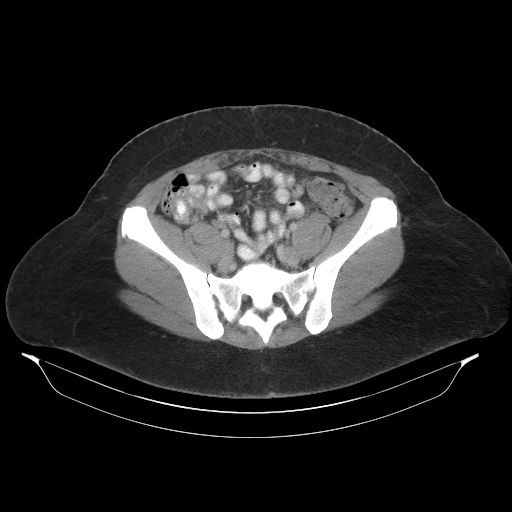
[im 38/52  soft-tissue]
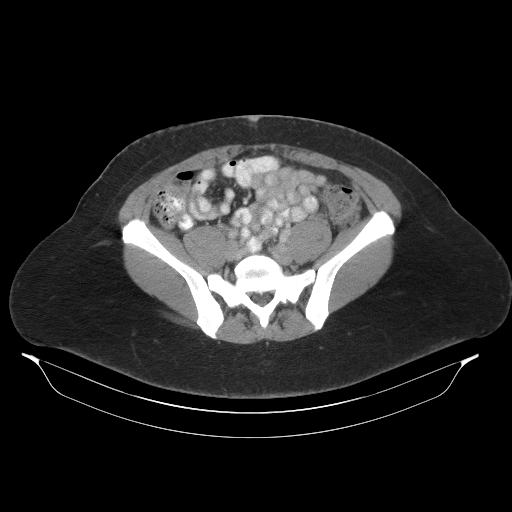
[im 42/52  soft-tissue]
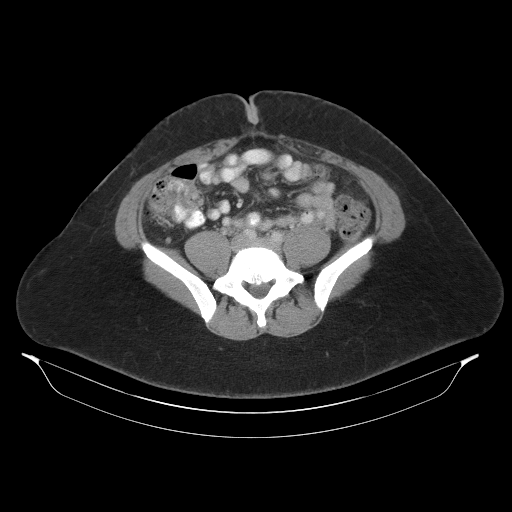
[im 45/52  soft-tissue]
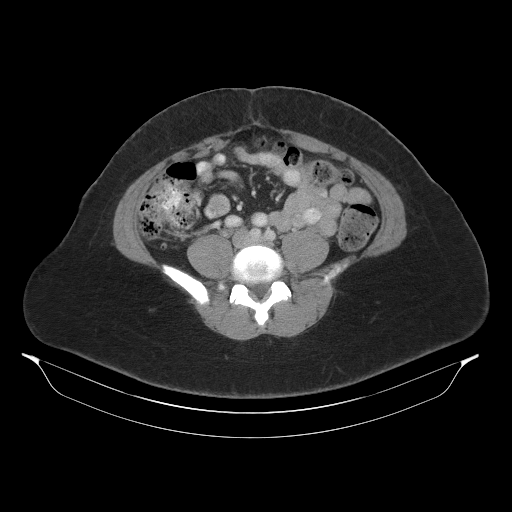
[im 45/52  lung]
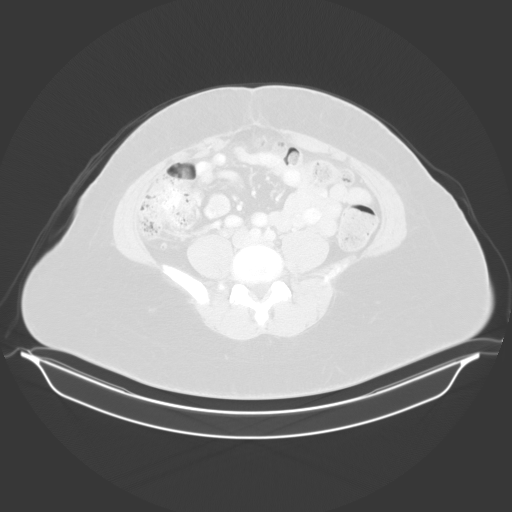
[im 47/52  lung]
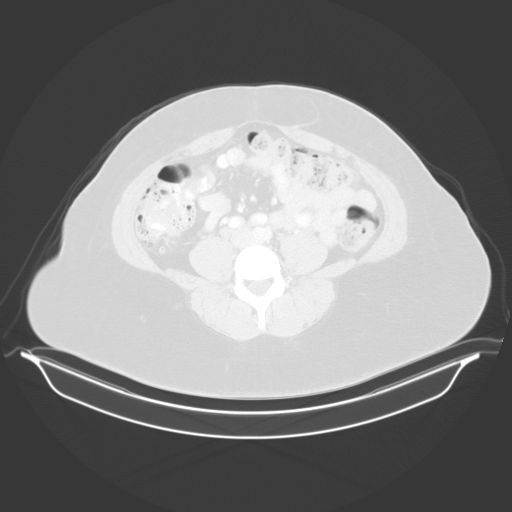
[im 48/52  soft-tissue]
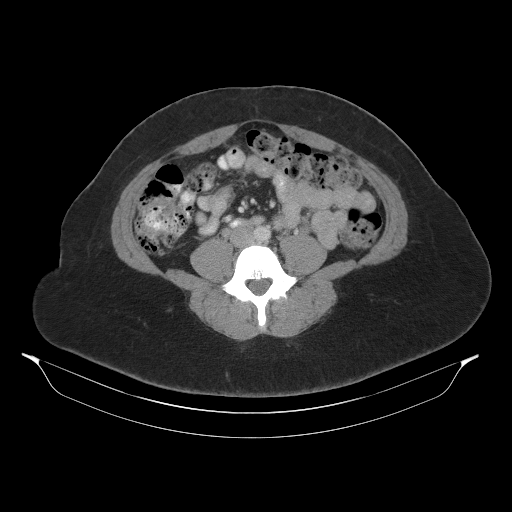
[im 48/52  lung]
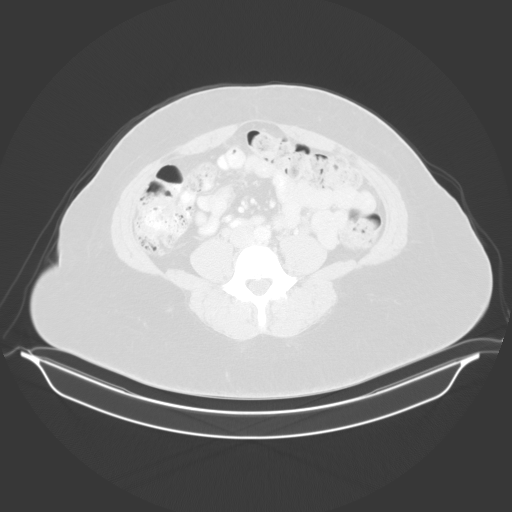
[im 50/52  lung]
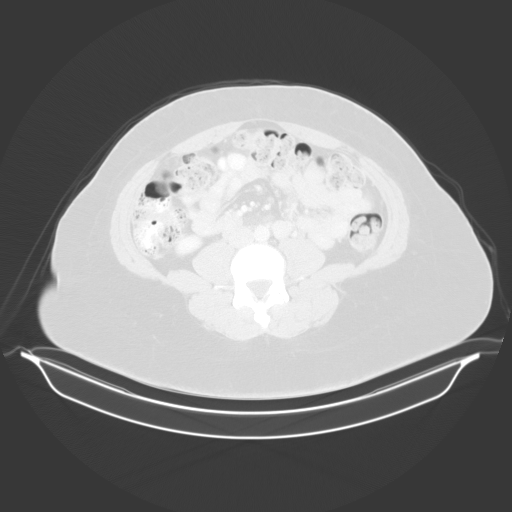

[16 of 32 positions shown; findings below may reference images not displayed]

FINDINGS: Urinary Tract:  Ureters decompressed.  Urinary bladder unremarkable.

Bowel: Unremarkable visualized pelvic bowel loops. Normal appendix.

Vascular/Lymphatic: No pathologically enlarged lymph nodes. No
significant vascular abnormality seen.

Reproductive:  Uterus and adnexa unremarkable.  No mass.

Other:  No free fluid or free air.

Musculoskeletal: No acute bony abnormality.
IMPRESSION: Unremarkable pelvic CT.

## 2022-04-20 NOTE — Progress Notes (Unsigned)
Patient ID: DAINTY LUTTRULL, female    DOB: Mar 11, 1980  MRN: [id number]  CC: Annual Physical Exam  Subjective: Yvonne Johnson is a 42 y.o. female who presents for annual physical exam.   Her concerns today include:  - Right ear discomfort. States feels like "pressure" inside of the ear. She denies additional symptoms.  - Needs refills on Acyclovir.   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   Genetic testing 01/09/2019   Family history of breast cancer    Family history of ovarian cancer    Family history of colon cancer    Family history of colonic polyps    Colon polyp 12/03/2017   Class 1 obesity due to excess calories without serious comorbidity with body mass index (BMI) of 32.0 to 32.9 in adult 12/25/2016   Herpes simplex 12/25/2016   CN (constipation) 04/17/2014   Generalized abdominal pain 04/17/2014   Abdominal bloating 04/17/2014   Abdominal pain, left lower quadrant 07/10/2013   Pseudotumor cerebri      Current Outpatient Medications on File Prior to Visit  Medication Sig Dispense Refill   acetaZOLAMIDE (DIAMOX) 500 MG capsule Take 1 capsule (500 mg total) by mouth 2 (two) times daily. 180 capsule 3   No current facility-administered medications on file prior to visit.    Allergies  Allergen Reactions   Penicillins Hives   Promethazine Nausea And Vomiting, Other (See Comments) and Palpitations    Social History   Socioeconomic History   Marital status: Married    Spouse name: Not on file   Number of children: 4   Years of education: Not on file   Highest education level: Not on file  Occupational History   Occupation: [employer]  Tobacco Use   Smoking status: Former    Types: Cigarettes    Quit date: 03/05/2003    Years since quitting: 19.1    Passive exposure: Current   Smokeless tobacco: Never  Vaping Use   Vaping Use: Never used  Substance and Sexual  Activity   Alcohol use: Yes    Alcohol/week: 1.0 standard drink of alcohol    Types: 1 Glasses of wine per week    Comment: social    Drug use: No   Sexual activity: Yes    Birth control/protection: None  Other Topics Concern   Not on file  Social History Narrative   Regular Exercise -  YES         Social Determinants of Health   Financial Resource Strain: Not on file  Food Insecurity: Not on file  Transportation Needs: Not on file  Physical Activity: Not on file  Stress: Not on file  Social Connections: Not on file  Intimate Partner Violence: Not on file    Family History  Problem Relation Age of Onset   Asthma Daughter    Asthma Son    Hypertension Maternal Aunt    Ovarian cancer Maternal Aunt 48   Breast cancer Maternal Aunt 60   Hypothyroidism Maternal Aunt 45       thyroid nodules   Hypertension Paternal Aunt    Breast cancer Paternal Aunt 31   Hypertension Mother    Hypothyroidism Mother 30       thyroid nodules   Arthritis Mother    Hypertension Father    Colon polyps Father    Diabetes Father    Hyperlipidemia Father  Hypertension Sister    Hypothyroidism Sister 98       thyroid nodules   Asthma Sister    Hypertension Maternal Grandmother    Miscarriages / Stillbirths Maternal Grandmother    Hypertension Maternal Grandfather    Liver disease Maternal Grandfather        liver cancer secondary to ETOH   Alcohol abuse Maternal Grandfather    Diabetes Paternal Grandmother    Hypertension Paternal Grandmother    Colon cancer Paternal Grandmother 3   Heart attack Paternal Grandfather    Hypertension Paternal Grandfather    Heart disease Paternal Grandfather 68       MI/CAD   Kidney disease Paternal Aunt        secondary to sarcoidosis    Past Surgical History:  Procedure Laterality Date   CESAREAN SECTION     x 3    ROS: Review of Systems Negative except as stated above  PHYSICAL EXAM: BP 114/81 (BP Location: Left Arm, Patient  Position: Sitting, Cuff Size: Normal)   Pulse 79   Temp 98.3 F (36.8 C)   Resp 16   Ht 5' 6.73" (1.695 m)   Wt 210 lb (95.3 kg)   SpO2 97%   BMI 33.16 kg/m   Physical Exam HENT:     Head: Normocephalic and atraumatic.     Right Ear: There is impacted cerumen.     Left Ear: Tympanic membrane, ear canal and external ear normal.     Nose: Nose normal.     Mouth/Throat:     Mouth: Mucous membranes are moist.     Pharynx: Oropharynx is clear.  Eyes:     Extraocular Movements: Extraocular movements intact.     Conjunctiva/sclera: Conjunctivae normal.     Pupils: Pupils are equal, round, and reactive to light.  Cardiovascular:     Rate and Rhythm: Normal rate and regular rhythm.     Pulses: Normal pulses.     Heart sounds: Normal heart sounds.  Pulmonary:     Effort: Pulmonary effort is normal.     Breath sounds: Normal breath sounds.  Chest:     Comments: Patient declined.  Abdominal:     General: Bowel sounds are normal.     Palpations: Abdomen is soft.  Genitourinary:    Comments: Patient declined.  Musculoskeletal:        General: Normal range of motion.     Right shoulder: Normal.     Left shoulder: Normal.     Right upper arm: Normal.     Left upper arm: Normal.     Right elbow: Normal.     Left elbow: Normal.     Right forearm: Normal.     Left forearm: Normal.     Right wrist: Normal.     Left wrist: Normal.     Right hand: Normal.     Left hand: Normal.     Cervical back: Normal, normal range of motion and neck supple.     Thoracic back: Normal.     Lumbar back: Normal.     Right hip: Normal.     Left hip: Normal.     Right upper leg: Normal.     Left upper leg: Normal.     Right knee: Normal.     Left knee: Normal.     Right lower leg: Normal.     Left lower leg: Normal.     Right ankle: Normal.     Left ankle: Normal.  Right foot: Normal.     Left foot: Normal.  Skin:    General: Skin is warm and dry.     Capillary Refill: Capillary refill  takes less than 2 seconds.  Neurological:     General: No focal deficit present.     Mental Status: She is alert and oriented to person, place, and time.  Psychiatric:        Mood and Affect: Mood normal.        Behavior: Behavior normal.     ASSESSMENT AND PLAN: 1. Annual physical exam - Counseled on 150 minutes of exercise per week as tolerated, healthy eating (including decreased daily intake of saturated fats, cholesterol, added sugars, sodium), STI prevention, and routine healthcare maintenance.  2. Screening for metabolic disorder - Routine screening.  - CMP14+EGFR  3. Screening for deficiency anemia - Routine screening.  - CBC  4. Diabetes mellitus screening - Routine screening.  - Hemoglobin A1c  5. Screening cholesterol level - Routine screening.  - Lipid panel  6. Thyroid disorder screen - Routine screening.  - TSH  7. Herpes simplex - Continue Acyclovir as prescribed.  - Follow-up with primary provider as scheduled.  - acyclovir (ZOVIRAX) 400 MG tablet; Take 1 tablet (400 mg total) by mouth 3 (three) times daily.  Dispense: 21 tablet; Refill: 2  8. Ceruminosis, right - Right ear lavage completed today in office by Elmon Else, CMA.    Patient was given the opportunity to ask questions.  Patient verbalized understanding of the plan and was able to repeat key elements of the plan. Patient was given clear instructions to go to Emergency Department or return to medical center if symptoms don't improve, worsen, or new problems develop.The patient verbalized understanding.   Orders Placed This Encounter  Procedures   CBC   Lipid panel   TSH   CMP14+EGFR   Hemoglobin A1c     Requested Prescriptions   Signed Prescriptions Disp Refills   acyclovir (ZOVIRAX) 400 MG tablet 21 tablet 2    Sig: Take 1 tablet (400 mg total) by mouth 3 (three) times daily.    Return in about 1 year (around 04/23/2023) for Physical per patient preference.  Camillia Herter, NP

## 2022-04-22 ENCOUNTER — Encounter: Payer: Self-pay | Admitting: Family

## 2022-04-22 ENCOUNTER — Ambulatory Visit (INDEPENDENT_AMBULATORY_CARE_PROVIDER_SITE_OTHER): Payer: BC Managed Care – PPO | Admitting: Family

## 2022-04-22 VITALS — BP 114/81 | HR 79 | Temp 98.3°F | Resp 16 | Ht 66.73 in | Wt 210.0 lb

## 2022-04-22 DIAGNOSIS — Z13220 Encounter for screening for lipoid disorders: Secondary | ICD-10-CM | POA: Diagnosis not present

## 2022-04-22 DIAGNOSIS — Z0000 Encounter for general adult medical examination without abnormal findings: Secondary | ICD-10-CM

## 2022-04-22 DIAGNOSIS — Z1329 Encounter for screening for other suspected endocrine disorder: Secondary | ICD-10-CM

## 2022-04-22 DIAGNOSIS — B009 Herpesviral infection, unspecified: Secondary | ICD-10-CM

## 2022-04-22 DIAGNOSIS — Z13228 Encounter for screening for other metabolic disorders: Secondary | ICD-10-CM

## 2022-04-22 DIAGNOSIS — Z0001 Encounter for general adult medical examination with abnormal findings: Secondary | ICD-10-CM

## 2022-04-22 DIAGNOSIS — Z131 Encounter for screening for diabetes mellitus: Secondary | ICD-10-CM

## 2022-04-22 DIAGNOSIS — H6121 Impacted cerumen, right ear: Secondary | ICD-10-CM

## 2022-04-22 DIAGNOSIS — Z130 Encounter for screening for diseases of the blood and blood-forming organs and certain disorders involving the immune mechanism: Secondary | ICD-10-CM

## 2022-04-22 MED ORDER — ACYCLOVIR 400 MG PO TABS: 400.0000 mg | ORAL_TABLET | Freq: Three times a day (TID) | ORAL | 2 refills | Status: AC

## 2022-04-22 NOTE — Progress Notes (Signed)
.  Pt presents for annual physical exam   -needs refill on acyclovir

## 2022-04-22 NOTE — Patient Instructions (Signed)
Preventive Care 40-44 Years Old, Female Preventive care refers to lifestyle choices and visits with your health care provider that can promote health and wellness. Preventive care visits are also called wellness exams. What can I expect for my preventive care visit? Counseling Your health care provider may ask you questions about your: Medical history, including: Past medical problems. Family medical history. Pregnancy history. Current health, including: Menstrual cycle. Method of birth control. Emotional well-being. Home life and relationship well-being. Sexual activity and sexual health. Lifestyle, including: Alcohol, nicotine or tobacco, and drug use. Access to firearms. Diet, exercise, and sleep habits. Work and work environment. Sunscreen use. Safety issues such as seatbelt and bike helmet use. Physical exam Your health care provider will check your: Height and weight. These may be used to calculate your BMI (body mass index). BMI is a measurement that tells if you are at a healthy weight. Waist circumference. This measures the distance around your waistline. This measurement also tells if you are at a healthy weight and may help predict your risk of certain diseases, such as type 2 diabetes and high blood pressure. Heart rate and blood pressure. Body temperature. Skin for abnormal spots. What immunizations do I need?  Vaccines are usually given at various ages, according to a schedule. Your health care provider will recommend vaccines for you based on your age, medical history, and lifestyle or other factors, such as travel or where you work. What tests do I need? Screening Your health care provider may recommend screening tests for certain conditions. This may include: Lipid and cholesterol levels. Diabetes screening. This is done by checking your blood sugar (glucose) after you have not eaten for a while (fasting). Pelvic exam and Pap test. Hepatitis B test. Hepatitis C  test. HIV (human immunodeficiency virus) test. STI (sexually transmitted infection) testing, if you are at risk. Lung cancer screening. Colorectal cancer screening. Mammogram. Talk with your health care provider about when you should start having regular mammograms. This may depend on whether you have a family history of breast cancer. BRCA-related cancer screening. This may be done if you have a family history of breast, ovarian, tubal, or peritoneal cancers. Bone density scan. This is done to screen for osteoporosis. Talk with your health care provider about your test results, treatment options, and if necessary, the need for more tests. Follow these instructions at home: Eating and drinking  Eat a diet that includes fresh fruits and vegetables, whole grains, lean protein, and low-fat dairy products. Take vitamin and mineral supplements as recommended by your health care provider. Do not drink alcohol if: Your health care provider tells you not to drink. You are pregnant, may be pregnant, or are planning to become pregnant. If you drink alcohol: Limit how much you have to 0-1 drink a day. Know how much alcohol is in your drink. In the [place] one drink equals one 12 oz bottle of beer (355 mL), one 5 oz glass of wine (148 mL), or one 1 oz glass of hard liquor (44 mL). Lifestyle Brush your teeth every morning and night with fluoride toothpaste. Floss one time each day. Exercise for at least 30 minutes 5 or more days each week. Do not use any products that contain nicotine or tobacco. These products include cigarettes, chewing tobacco, and vaping devices, such as e-cigarettes. If you need help quitting, ask your health care provider. Do not use drugs. If you are sexually active, practice safe sex. Use a condom or other form of protection to   prevent STIs. If you do not wish to become pregnant, use a form of birth control. If you plan to become pregnant, see your health care provider for a  prepregnancy visit. Take aspirin only as told by your health care provider. Make sure that you understand how much to take and what form to take. Work with your health care provider to find out whether it is safe and beneficial for you to take aspirin daily. Find healthy ways to manage stress, such as: Meditation, yoga, or listening to music. Journaling. Talking to a trusted person. Spending time with friends and family. Minimize exposure to UV radiation to reduce your risk of skin cancer. Safety Always wear your seat belt while driving or riding in a vehicle. Do not drive: If you have been drinking alcohol. Do not ride with someone who has been drinking. When you are tired or distracted. While texting. If you have been using any mind-altering substances or drugs. Wear a helmet and other protective equipment during sports activities. If you have firearms in your house, make sure you follow all gun safety procedures. Seek help if you have been physically or sexually abused. What's next? Visit your health care provider once a year for an annual wellness visit. Ask your health care provider how often you should have your eyes and teeth checked. Stay up to date on all vaccines. This information is not intended to replace advice given to you by your health care provider. Make sure you discuss any questions you have with your health care provider. Document Revised: 08/14/2020 Document Reviewed: 08/14/2020 Elsevier Patient Education  2023 [employer]  

## 2022-04-23 LAB — CMP14+EGFR
ALT: 8 IU/L (ref 0–32)
AST: 16 IU/L (ref 0–40)
Albumin/Globulin Ratio: 1.3 (ref 1.2–2.2)
Albumin: 4 g/dL (ref 3.9–4.9)
Alkaline Phosphatase: 60 IU/L (ref 44–121)
BUN/Creatinine Ratio: 11 (ref 9–23)
BUN: 9 mg/dL (ref 6–24)
Bilirubin Total: 0.2 mg/dL (ref 0.0–1.2)
CO2: 20 mmol/L (ref 20–29)
Calcium: 9.1 mg/dL (ref 8.7–10.2)
Chloride: 107 mmol/L — ABNORMAL HIGH (ref 96–106)
Creatinine, Ser: 0.79 mg/dL (ref 0.57–1.00)
Globulin, Total: 3.2 g/dL (ref 1.5–4.5)
Glucose: 67 mg/dL — ABNORMAL LOW (ref 70–99)
Potassium: 4.1 mmol/L (ref 3.5–5.2)
Sodium: 143 mmol/L (ref 134–144)
Total Protein: 7.2 g/dL (ref 6.0–8.5)
eGFR: 95 mL/min/{1.73_m2} (ref >59)

## 2022-04-23 LAB — CBC
Hematocrit: 34.5 % (ref 34.0–46.6)
Hemoglobin: 11.6 g/dL (ref 11.1–15.9)
MCH: 31.2 pg (ref 26.6–33.0)
MCHC: 33.6 g/dL (ref 31.5–35.7)
MCV: 93 fL (ref 79–97)
Platelets: 244 10*3/uL (ref 150–450)
RBC: 3.72 x10E6/uL — ABNORMAL LOW (ref 3.77–5.28)
RDW: 14.4 % (ref 11.7–15.4)
WBC: 3.5 10*3/uL (ref 3.4–10.8)

## 2022-04-23 LAB — HEMOGLOBIN A1C
Est. average glucose Bld gHb Est-mCnc: 108 mg/dL
Hgb A1c MFr Bld: 5.4 % (ref 4.8–5.6)

## 2022-04-23 LAB — LIPID PANEL
Chol/HDL Ratio: 1.7 ratio (ref 0.0–4.4)
Cholesterol, Total: 162 mg/dL (ref 100–199)
HDL: 97 mg/dL (ref >39)
LDL Chol Calc (NIH): 54 mg/dL (ref 0–99)
Triglycerides: 49 mg/dL (ref 0–149)
VLDL Cholesterol Cal: 11 mg/dL (ref 5–40)

## 2022-04-23 LAB — TSH: TSH: 1.37 u[IU]/mL (ref 0.450–4.500)

## 2022-06-17 ENCOUNTER — Encounter: Payer: Self-pay | Admitting: Family

## 2022-07-02 ENCOUNTER — Telehealth: Payer: BC Managed Care – PPO | Admitting: Physician Assistant

## 2022-07-02 DIAGNOSIS — R3989 Other symptoms and signs involving the genitourinary system: Secondary | ICD-10-CM

## 2022-07-02 MED ORDER — NITROFURANTOIN MONOHYD MACRO 100 MG PO CAPS: 100.0000 mg | ORAL_CAPSULE | Freq: Two times a day (BID) | ORAL | 0 refills | Status: DC

## 2022-07-02 NOTE — Progress Notes (Signed)

## 2022-07-02 NOTE — Progress Notes (Signed)
I have spent 5 minutes in review of e-visit questionnaire, review and updating patient chart, medical decision making and response to patient.   Lessa Huge Cody Unita Detamore, PA-C    

## 2022-07-03 ENCOUNTER — Encounter: Payer: Self-pay | Admitting: Gastroenterology

## 2022-08-24 DIAGNOSIS — Z1231 Encounter for screening mammogram for malignant neoplasm of breast: Secondary | ICD-10-CM | POA: Diagnosis not present

## 2022-09-11 ENCOUNTER — Encounter: Payer: Self-pay | Admitting: Family

## 2023-02-25 ENCOUNTER — Telehealth: Payer: BC Managed Care – PPO | Admitting: Nurse Practitioner

## 2023-02-25 DIAGNOSIS — R3989 Other symptoms and signs involving the genitourinary system: Secondary | ICD-10-CM | POA: Diagnosis not present

## 2023-02-25 MED ORDER — NITROFURANTOIN MONOHYD MACRO 100 MG PO CAPS
100.0000 mg | ORAL_CAPSULE | Freq: Two times a day (BID) | ORAL | 0 refills | Status: AC
Start: 2023-02-25 — End: 2023-03-02

## 2023-02-25 NOTE — Progress Notes (Signed)
E-Visit for Urinary Problems  We are sorry that you are not feeling well.  Here is how we plan to help!  Based on what you shared with me it looks like you most likely have a simple urinary tract infection.  A UTI (Urinary Tract Infection) is a bacterial infection of the bladder.  Most cases of urinary tract infections are simple to treat but a key part of your care is to encourage you to drink plenty of fluids and watch your symptoms carefully.  I have prescribed MacroBid 100 mg twice a day for 5 days.  Your symptoms should gradually improve. Call us if the burning in your urine worsens, you develop worsening fever, back pain or pelvic pain or if your symptoms do not resolve after completing the antibiotic.  Urinary tract infections can be prevented by drinking plenty of water to keep your body hydrated.  Also be sure when you wipe, wipe from front to back and don't hold it in!  If possible, empty your bladder every 4 hours.  HOME CARE Drink plenty of fluids Compete the full course of the antibiotics even if the symptoms resolve Remember, when you need to go.go. Holding in your urine can increase the likelihood of getting a UTI! GET HELP RIGHT AWAY IF: You cannot urinate You get a high fever Worsening back pain occurs You see blood in your urine You feel sick to your stomach or throw up You feel like you are going to pass out  MAKE SURE YOU  Understand these instructions. Will watch your condition. Will get help right away if you are not doing well or get worse.   Thank you for choosing an e-visit.  Your e-visit answers were reviewed by a board certified advanced clinical practitioner to complete your personal care plan. Depending upon the condition, your plan could have included both over the counter or prescription medications.  Please review your pharmacy choice. Make sure the pharmacy is open so you can pick up prescription now. If there is a problem, you may contact your  provider through MyChart messaging and have the prescription routed to another pharmacy.  Your safety is important to us. If you have drug allergies check your prescription carefully.   For the next 24 hours you can use MyChart to ask questions about today's visit, request a non-urgent call back, or ask for a work or school excuse. You will get an email in the next two days asking about your experience. I hope that your e-visit has been valuable and will speed your recovery.   Meds ordered this encounter  Medications   nitrofurantoin, macrocrystal-monohydrate, (MACROBID) 100 MG capsule    Sig: Take 1 capsule (100 mg total) by mouth 2 (two) times daily for 5 days.    Dispense:  10 capsule    Refill:  0     I spent approximately 5 minutes reviewing the patient's history, current symptoms and coordinating their care today.   

## 2023-08-30 DIAGNOSIS — Z1231 Encounter for screening mammogram for malignant neoplasm of breast: Secondary | ICD-10-CM | POA: Diagnosis not present

## 2023-09-20 ENCOUNTER — Ambulatory Visit (INDEPENDENT_AMBULATORY_CARE_PROVIDER_SITE_OTHER): Admitting: Family

## 2023-09-20 VITALS — BP 119/79 | HR 76 | Temp 98.0°F | Resp 16 | Ht 68.0 in | Wt 218.4 lb

## 2023-09-20 DIAGNOSIS — Z13220 Encounter for screening for lipoid disorders: Secondary | ICD-10-CM | POA: Diagnosis not present

## 2023-09-20 DIAGNOSIS — Z131 Encounter for screening for diabetes mellitus: Secondary | ICD-10-CM | POA: Diagnosis not present

## 2023-09-20 DIAGNOSIS — Z130 Encounter for screening for diseases of the blood and blood-forming organs and certain disorders involving the immune mechanism: Secondary | ICD-10-CM | POA: Diagnosis not present

## 2023-09-20 DIAGNOSIS — Z13228 Encounter for screening for other metabolic disorders: Secondary | ICD-10-CM

## 2023-09-20 DIAGNOSIS — Z1231 Encounter for screening mammogram for malignant neoplasm of breast: Secondary | ICD-10-CM

## 2023-09-20 DIAGNOSIS — Z0000 Encounter for general adult medical examination without abnormal findings: Secondary | ICD-10-CM

## 2023-09-20 DIAGNOSIS — Z1329 Encounter for screening for other suspected endocrine disorder: Secondary | ICD-10-CM

## 2023-09-20 DIAGNOSIS — Z1211 Encounter for screening for malignant neoplasm of colon: Secondary | ICD-10-CM

## 2023-09-20 NOTE — Progress Notes (Signed)
 Patient ID: LISL SLINGERLAND, female    DOB: 14-Mar-1980  MRN: [id number]  CC: Annual Exam  Subjective: Yvonne Johnson is a 43 y.o. female who presents for annual exam.   Her concerns today include:  - Due for mammogram. Last mammogram 08/24/2022  - Up to date on cervical cancer screening per [facility]. - States she prefers Cologuard.  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   Genetic testing 01/09/2019   Family history of breast cancer    Family history of ovarian cancer    Family history of colon cancer    Family history of colonic polyps    Colon polyp 12/03/2017   Class 1 obesity due to excess calories without serious comorbidity with body mass index (BMI) of 32.0 to 32.9 in adult 12/25/2016   Herpes simplex 12/25/2016   Constipation 04/17/2014   Generalized abdominal pain 04/17/2014   Abdominal bloating 04/17/2014   Abdominal pain, left lower quadrant 07/10/2013   Pseudotumor cerebri      Current Outpatient Medications on File Prior to Visit  Medication Sig Dispense Refill   acetaZOLAMIDE  (DIAMOX ) 500 MG capsule Take 1 capsule (500 mg total) by mouth 2 (two) times daily. 180 capsule 3   acyclovir  (ZOVIRAX ) 400 MG tablet Take 1 tablet (400 mg total) by mouth 3 (three) times daily. 21 tablet 2   No current facility-administered medications on file prior to visit.    Allergies  Allergen Reactions   Penicillins Hives   Promethazine Nausea And Vomiting, Other (See Comments) and Palpitations    Social History   Socioeconomic History   Marital status: Married    Spouse name: Not on file   Number of children: 4   Years of education: Not on file   Highest education level: Master's degree (e.g., MA, MS, MEng, MEd, MSW, MBA)  Occupational History   Occupation: MBA [employer]  Tobacco Use   Smoking status: Former    Current packs/day: 0.00    Types: Cigarettes    Quit date:  03/05/2003    Years since quitting: 20.5    Passive exposure: Current   Smokeless tobacco: Never  Vaping Use   Vaping status: Never Used  Substance and Sexual Activity   Alcohol use: Yes    Alcohol/week: 1.0 standard drink of alcohol    Types: 1 Glasses of wine per week    Comment: social    Drug use: No   Sexual activity: Yes    Birth control/protection: None  Other Topics Concern   Not on file  Social History Narrative   Regular Exercise -  YES         Social Drivers of Health   Financial Resource Strain: Low Risk  (09/20/2023)   Overall Financial Resource Strain (CARDIA)    Difficulty of Paying Living Expenses: Not hard at all  Food Insecurity: No Food Insecurity (09/20/2023)   Hunger Vital Sign    Worried About Running Out of Food in the Last Year: Never true    Ran Out of Food in the Last Year: Never true  Transportation Needs: No Transportation Needs (09/20/2023)   PRAPARE - Administrator, Civil Service (Medical): No    Lack of Transportation (Non-Medical): No  Physical Activity: Insufficiently Active (09/20/2023)   Exercise Vital Sign    Days of Exercise per Week: 3 days    Minutes of Exercise per Session:  30 min  Stress: No Stress Concern Present (09/20/2023)   [employer] of Occupational Health - Occupational Stress Questionnaire    Feeling of Stress: Only a little  Social Connections: Socially Isolated (09/20/2023)   Social Connection and Isolation Panel    Frequency of Communication with Friends and Family: Twice a week    Frequency of Social Gatherings with Friends and Family: Never    Attends Religious Services: Never    Database administrator or Organizations: No    Attends Engineer, structural: Not on file    Marital Status: Married  Catering manager Violence: Not At Risk (09/20/2023)   Humiliation, Afraid, Rape, and Kick questionnaire    Fear of Current or Ex-Partner: No    Emotionally Abused: No    Physically Abused: No     Sexually Abused: No    Family History  Problem Relation Age of Onset   Asthma Daughter    Asthma Son    Hypertension Maternal Aunt    Ovarian cancer Maternal Aunt 50   Breast cancer Maternal Aunt 60   Hypothyroidism Maternal Aunt 45       thyroid  nodules   Hypertension Paternal Aunt    Breast cancer Paternal Aunt 23   Hypertension Mother    Hypothyroidism Mother 51       thyroid  nodules   Arthritis Mother    Hypertension Father    Colon polyps Father    Diabetes Father    Hyperlipidemia Father    Hypertension Sister    Hypothyroidism Sister 51       thyroid  nodules   Asthma Sister    Hypertension Maternal Grandmother    Miscarriages / Stillbirths Maternal Grandmother    Hypertension Maternal Grandfather    Liver disease Maternal Grandfather        liver cancer secondary to ETOH   Alcohol abuse Maternal Grandfather    Diabetes Paternal Grandmother    Hypertension Paternal Grandmother    Colon cancer Paternal Grandmother 60   Heart attack Paternal Grandfather    Hypertension Paternal Grandfather    Heart disease Paternal Grandfather 70       MI/CAD   Kidney disease Paternal Aunt        secondary to sarcoidosis    Past Surgical History:  Procedure Laterality Date   CESAREAN SECTION     x 3    ROS: Review of Systems Negative except as stated above  PHYSICAL EXAM: BP 119/79   Pulse 76   Temp 98 F (36.7 C) (Oral)   Resp 16   Ht 5' 8 (1.727 m)   Wt 218 lb 6.4 oz (99.1 kg)   LMP 09/14/2023 (Approximate)   SpO2 98%   BMI 33.21 kg/m   Physical Exam HENT:     Head: Normocephalic and atraumatic.     Right Ear: Tympanic membrane, ear canal and external ear normal.     Left Ear: Tympanic membrane, ear canal and external ear normal.     Nose: Nose normal.     Mouth/Throat:     Mouth: Mucous membranes are moist.     Pharynx: Oropharynx is clear.  Eyes:     Extraocular Movements: Extraocular movements intact.     Conjunctiva/sclera: Conjunctivae normal.      Pupils: Pupils are equal, round, and reactive to light.  Neck:     Thyroid : No thyroid  mass, thyromegaly or thyroid  tenderness.  Cardiovascular:     Rate and Rhythm: Normal rate and regular rhythm.  Pulses: Normal pulses.     Heart sounds: Normal heart sounds.  Pulmonary:     Effort: Pulmonary effort is normal.     Breath sounds: Normal breath sounds.  Chest:     Comments: Patient declined. Abdominal:     General: Bowel sounds are normal.     Palpations: Abdomen is soft.  Genitourinary:    Comments: Patient declined. Musculoskeletal:        General: Normal range of motion.     Right shoulder: Normal.     Left shoulder: Normal.     Right upper arm: Normal.     Left upper arm: Normal.     Right elbow: Normal.     Left elbow: Normal.     Right forearm: Normal.     Left forearm: Normal.     Right wrist: Normal.     Left wrist: Normal.     Right hand: Normal.     Left hand: Normal.     Cervical back: Normal, normal range of motion and neck supple.     Thoracic back: Normal.     Lumbar back: Normal.     Right hip: Normal.     Left hip: Normal.     Right upper leg: Normal.     Left upper leg: Normal.     Right knee: Normal.     Left knee: Normal.     Right lower leg: Normal.     Left lower leg: Normal.     Right ankle: Normal.     Left ankle: Normal.     Right foot: Normal.     Left foot: Normal.  Skin:    General: Skin is warm and dry.     Capillary Refill: Capillary refill takes less than 2 seconds.  Neurological:     General: No focal deficit present.     Mental Status: She is alert and oriented to person, place, and time.  Psychiatric:        Mood and Affect: Mood normal.        Behavior: Behavior normal.     ASSESSMENT AND PLAN: 1. Annual physical exam (Primary) - Counseled on 150 minutes of exercise per week as tolerated, healthy eating (including decreased daily intake of saturated fats, cholesterol, added sugars, sodium), STI prevention, and  routine healthcare maintenance.  2. Screening for metabolic disorder - Routine screening.  - CMP14+EGFR  3. Screening for deficiency anemia - Routine screening.  - CBC  4. Diabetes mellitus screening - Routine screening.  - Hemoglobin A1c  5. Screening cholesterol level - Routine screening.  - Lipid panel  6. Thyroid  disorder screen - Routine screening.  - TSH  7. Encounter for screening mammogram for malignant neoplasm of breast - Routine screening.  - MM Digital Screening; Future  8. Colon cancer screening - Routine screening.  - Cologuard   Patient was given the opportunity to ask questions.  Patient verbalized understanding of the plan and was able to repeat key elements of the plan. Patient was given clear instructions to go to Emergency Department or return to medical center if symptoms don't improve, worsen, or new problems develop.The patient verbalized understanding.   Orders Placed This Encounter  Procedures   MM Digital Screening   Cologuard   CBC   Lipid panel   CMP14+EGFR   Hemoglobin A1c   TSH    Return in about 1 year (around 09/19/2024) for Physical per patient preference.  Greig JINNY Drones, NP

## 2023-09-21 ENCOUNTER — Ambulatory Visit: Payer: Self-pay | Admitting: Family

## 2023-09-21 DIAGNOSIS — Z1211 Encounter for screening for malignant neoplasm of colon: Secondary | ICD-10-CM

## 2023-09-21 LAB — CMP14+EGFR
ALT: 17 IU/L (ref 0–32)
AST: 22 IU/L (ref 0–40)
Albumin: 4.1 g/dL (ref 3.9–4.9)
Alkaline Phosphatase: 69 IU/L (ref 44–121)
BUN/Creatinine Ratio: 12 (ref 9–23)
BUN: 10 mg/dL (ref 6–24)
Bilirubin Total: <0.2 mg/dL (ref 0.0–1.2)
CO2: 22 mmol/L (ref 20–29)
Calcium: 9 mg/dL (ref 8.7–10.2)
Chloride: 105 mmol/L (ref 96–106)
Creatinine, Ser: 0.84 mg/dL (ref 0.57–1.00)
Globulin, Total: 3 g/dL (ref 1.5–4.5)
Glucose: 65 mg/dL — ABNORMAL LOW (ref 70–99)
Potassium: 4.3 mmol/L (ref 3.5–5.2)
Sodium: 141 mmol/L (ref 134–144)
Total Protein: 7.1 g/dL (ref 6.0–8.5)
eGFR: 87 mL/min/1.73 (ref >59)

## 2023-09-21 LAB — CBC
Hematocrit: 34.6 % (ref 34.0–46.6)
Hemoglobin: 11.1 g/dL (ref 11.1–15.9)
MCH: 30.1 pg (ref 26.6–33.0)
MCHC: 32.1 g/dL (ref 31.5–35.7)
MCV: 94 fL (ref 79–97)
Platelets: 235 x10E3/uL (ref 150–450)
RBC: 3.69 x10E6/uL — ABNORMAL LOW (ref 3.77–5.28)
RDW: 14.8 % (ref 11.7–15.4)
WBC: 4.1 x10E3/uL (ref 3.4–10.8)

## 2023-09-21 LAB — LIPID PANEL
Chol/HDL Ratio: 1.7 ratio (ref 0.0–4.4)
Cholesterol, Total: 181 mg/dL (ref 100–199)
HDL: 106 mg/dL (ref >39)
LDL Chol Calc (NIH): 61 mg/dL (ref 0–99)
Triglycerides: 76 mg/dL (ref 0–149)
VLDL Cholesterol Cal: 14 mg/dL (ref 5–40)

## 2023-09-21 LAB — TSH: TSH: 1.49 u[IU]/mL (ref 0.450–4.500)

## 2023-09-21 LAB — HEMOGLOBIN A1C
Est. average glucose Bld gHb Est-mCnc: 105 mg/dL
Hgb A1c MFr Bld: 5.3 % (ref 4.8–5.6)

## 2024-01-25 ENCOUNTER — Telehealth: Admitting: Physician Assistant

## 2024-01-25 DIAGNOSIS — R3989 Other symptoms and signs involving the genitourinary system: Secondary | ICD-10-CM | POA: Diagnosis not present

## 2024-01-25 MED ORDER — SULFAMETHOXAZOLE-TRIMETHOPRIM 800-160 MG PO TABS: 1.0000 | ORAL_TABLET | Freq: Two times a day (BID) | ORAL | 0 refills | Status: AC

## 2024-01-25 NOTE — Progress Notes (Signed)

## 2024-03-16 ENCOUNTER — Ambulatory Visit: Admitting: Family Medicine

## 2024-03-16 ENCOUNTER — Encounter: Payer: Self-pay | Admitting: Family Medicine

## 2024-03-16 VITALS — BP 115/76 | HR 91 | Ht 68.0 in | Wt 217.2 lb

## 2024-03-16 DIAGNOSIS — R109 Unspecified abdominal pain: Secondary | ICD-10-CM

## 2024-03-16 NOTE — Progress Notes (Unsigned)
 "  Established Patient Office Visit  Subjective    Patient ID: Yvonne Johnson, female    DOB: June 29, 1980  Age: 44 y.o. MRN: [id number]  CC:  Chief Complaint  Patient presents with   Abdominal Pain    HPI Yvonne Johnson presents ***  Outpatient Encounter Medications as of 03/16/2024  Medication Sig   acetaZOLAMIDE  (DIAMOX ) 500 MG capsule Take 1 capsule (500 mg total) by mouth 2 (two) times daily.   acyclovir  (ZOVIRAX ) 400 MG tablet Take 1 tablet (400 mg total) by mouth 3 (three) times daily.   sulfamethoxazole -trimethoprim  (BACTRIM  DS) 800-160 MG tablet Take 1 tablet by mouth 2 (two) times daily. (Patient not taking: Reported on 03/16/2024)   No facility-administered encounter medications on file as of 03/16/2024.    Past Medical History:  Diagnosis Date   Family history of breast cancer    Family history of colon cancer    Family history of colonic polyps    Family history of ovarian cancer    Idiopathic intracranial hypertension    Pseudotumor cerebri     Past Surgical History:  Procedure Laterality Date   CESAREAN SECTION     x 3    Family History  Problem Relation Age of Onset   Asthma Daughter    Asthma Son    Hypertension Maternal Aunt    Ovarian cancer Maternal Aunt 57   Breast cancer Maternal Aunt 60   Hypothyroidism Maternal Aunt 45       thyroid  nodules   Hypertension Paternal Aunt    Breast cancer Paternal Aunt 78   Hypertension Mother    Hypothyroidism Mother 17       thyroid  nodules   Arthritis Mother    Hypertension Father    Colon polyps Father    Diabetes Father    Hyperlipidemia Father    Hypertension Sister    Hypothyroidism Sister 64       thyroid  nodules   Asthma Sister    Hypertension Maternal Grandmother    Miscarriages / Stillbirths Maternal Grandmother    Hypertension Maternal Grandfather    Liver disease Maternal Grandfather        liver cancer secondary to ETOH   Alcohol abuse Maternal  Grandfather    Diabetes Paternal Grandmother    Hypertension Paternal Grandmother    Colon cancer Paternal Grandmother 60   Heart attack Paternal Grandfather    Hypertension Paternal Grandfather    Heart disease Paternal Grandfather 78       MI/CAD   Kidney disease Paternal Aunt        secondary to sarcoidosis    Social History   Socioeconomic History   Marital status: Married    Spouse name: Not on file   Number of children: 4   Years of education: Not on file   Highest education level: Master's degree (e.g., MA, MS, MEng, MEd, MSW, MBA)  Occupational History   Occupation: MBA [employer]  Tobacco Use   Smoking status: Former    Current packs/day: 0.00    Types: Cigarettes    Quit date: 03/05/2003    Years since quitting: 21.0    Passive exposure: Current   Smokeless tobacco: Never  Vaping Use   Vaping status: Never Used  Substance and Sexual Activity   Alcohol use: Yes    Alcohol/week: 1.0 standard drink of alcohol    Types: 1 Glasses of wine per week    Comment: social    Drug use: No  Sexual activity: Yes    Birth control/protection: None  Other Topics Concern   Not on file  Social History Narrative   Regular Exercise -  YES         Social Drivers of Health   Tobacco Use: Medium Risk (03/16/2024)   Patient History    Smoking Tobacco Use: Former    Smokeless Tobacco Use: Never    Passive Exposure: Current  Physicist, Medical Strain: Low Risk (09/20/2023)   Overall Financial Resource Strain (CARDIA)    Difficulty of Paying Living Expenses: Not hard at all  Food Insecurity: No Food Insecurity (09/20/2023)   Epic    Worried About Programme Researcher, Broadcasting/film/video in the Last Year: Never true    Ran Out of Food in the Last Year: Never true  Transportation Needs: No Transportation Needs (09/20/2023)   Epic    Lack of Transportation (Medical): No    Lack of Transportation (Non-Medical): No  Physical Activity: Insufficiently Active (09/20/2023)    Exercise Vital Sign    Days of Exercise per Week: 3 days    Minutes of Exercise per Session: 30 min  Stress: No Stress Concern Present (09/20/2023)   [employer] of Occupational Health - Occupational Stress Questionnaire    Feeling of Stress: Only a little  Social Connections: Socially Isolated (09/20/2023)   Social Connection and Isolation Panel    Frequency of Communication with Friends and Family: Twice a week    Frequency of Social Gatherings with Friends and Family: Never    Attends Religious Services: Never    Database Administrator or Organizations: No    Attends Engineer, Structural: Not on file    Marital Status: Married  Catering Manager Violence: Not At Risk (09/20/2023)   Epic    Fear of Current or Ex-Partner: No    Emotionally Abused: No    Physically Abused: No    Sexually Abused: No  Depression (PHQ2-9): Low Risk (09/20/2023)   Depression (PHQ2-9)    PHQ-2 Score: 0  Alcohol Screen: Low Risk (09/20/2023)   Alcohol Screen    Last Alcohol Screening Score (AUDIT): 1  Housing: Unknown (09/20/2023)   Epic    Unable to Pay for Housing in the Last Year: No    Number of Times Moved in the Last Year: Not on file    Homeless in the Last Year: No  Utilities: Not At Risk (09/20/2023)   Epic    Threatened with loss of utilities: No  Health Literacy: Adequate Health Literacy (09/20/2023)   B1300 Health Literacy    Frequency of need for help with medical instructions: Never    ROS      Objective    BP 115/76   Pulse 91   Ht 5' 8 (1.727 m)   Wt 217 lb 3.2 oz (98.5 kg)   LMP 02/24/2024 (Exact Date)   SpO2 99%   BMI 33.03 kg/m   Physical Exam  {Labs (Optional):23779}    Assessment & Plan:   There are no diagnoses linked to this encounter.   No follow-ups on file.   Tanda Raguel SQUIBB, MD  "

## 2024-03-17 ENCOUNTER — Ambulatory Visit: Payer: Self-pay | Admitting: Family Medicine

## 2024-03-17 ENCOUNTER — Encounter: Payer: Self-pay | Admitting: Family Medicine

## 2024-03-17 LAB — COMPREHENSIVE METABOLIC PANEL WITH GFR
ALT: 15 IU/L (ref 0–32)
AST: 11 IU/L (ref 0–40)
Albumin: 4.3 g/dL (ref 3.9–4.9)
Alkaline Phosphatase: 62 IU/L (ref 41–116)
BUN/Creatinine Ratio: 12 (ref 9–23)
BUN: 12 mg/dL (ref 6–24)
Bilirubin Total: 0.2 mg/dL (ref 0.0–1.2)
CO2: 21 mmol/L (ref 20–29)
Calcium: 9.5 mg/dL (ref 8.7–10.2)
Chloride: 105 mmol/L (ref 96–106)
Creatinine, Ser: 0.98 mg/dL (ref 0.57–1.00)
Globulin, Total: 3.5 g/dL (ref 1.5–4.5)
Glucose: 68 mg/dL — ABNORMAL LOW (ref 70–99)
Potassium: 4.7 mmol/L (ref 3.5–5.2)
Sodium: 139 mmol/L (ref 134–144)
Total Protein: 7.8 g/dL (ref 6.0–8.5)
eGFR: 73 mL/min/1.73

## 2024-03-23 LAB — COLOGUARD

## 2024-03-24 NOTE — Addendum Note (Signed)
 Addended byBETHA JAYCEE GREIG JINNY on: 03/24/2024 07:47 AM   Modules accepted: Orders

## 2024-04-12 ENCOUNTER — Encounter: Admitting: Obstetrics & Gynecology
